# Patient Record
Sex: Female | Born: 1970 | Race: Black or African American | Hispanic: No | Marital: Married
Health system: Southern US, Community
[De-identification: ages and names within clinical notes are randomized; demographics above are authoritative.]

## PROBLEM LIST (undated history)

## (undated) HISTORY — PX: HERNIA REPAIR: SHX51

---

## 2020-04-04 ENCOUNTER — Emergency Department (HOSPITAL_COMMUNITY)
Admission: EM | Admit: 2020-04-04 | Discharge: 2020-04-04 | Disposition: A | Discharge status: Home / Self Care | Payer: BLUE CROSS/BLUE SHIELD | Attending: Emergency Medicine | Admitting: Emergency Medicine

## 2020-04-04 ENCOUNTER — Other Ambulatory Visit: Payer: Self-pay

## 2020-04-04 ENCOUNTER — Encounter (HOSPITAL_COMMUNITY): Payer: Self-pay

## 2020-04-04 DIAGNOSIS — R109 Unspecified abdominal pain: Secondary | ICD-10-CM | POA: Insufficient documentation

## 2020-04-04 DIAGNOSIS — M549 Dorsalgia, unspecified: Secondary | ICD-10-CM | POA: Diagnosis not present

## 2020-04-04 DIAGNOSIS — Z5321 Procedure and treatment not carried out due to patient leaving prior to being seen by health care provider: Secondary | ICD-10-CM | POA: Insufficient documentation

## 2020-04-04 LAB — COMPREHENSIVE METABOLIC PANEL
ALT: 13 U/L (ref 0–44)
AST: 14 U/L — ABNORMAL LOW (ref 15–41)
Albumin: 3.5 g/dL (ref 3.5–5.0)
Alkaline Phosphatase: 79 U/L (ref 38–126)
Anion gap: 10 (ref 5–15)
BUN: 12 mg/dL (ref 6–20)
CO2: 23 mmol/L (ref 22–32)
Calcium: 9.1 mg/dL (ref 8.9–10.3)
Chloride: 105 mmol/L (ref 98–111)
Creatinine, Ser: 0.92 mg/dL (ref 0.44–1.00)
GFR, Estimated: >60 mL/min (ref >60)
Glucose, Bld: 92 mg/dL (ref 70–99)
Potassium: 3.7 mmol/L (ref 3.5–5.1)
Sodium: 138 mmol/L (ref 135–145)
Total Bilirubin: 0.5 mg/dL (ref 0.3–1.2)
Total Protein: 7.1 g/dL (ref 6.5–8.1)

## 2020-04-04 LAB — CBC
HCT: 40.7 % (ref 36.0–46.0)
Hemoglobin: 13.3 g/dL (ref 12.0–15.0)
MCH: 23.8 pg — ABNORMAL LOW (ref 26.0–34.0)
MCHC: 32.7 g/dL (ref 30.0–36.0)
MCV: 72.9 fL — ABNORMAL LOW (ref 80.0–100.0)
Platelets: 218 10*3/uL (ref 150–400)
RBC: 5.58 MIL/uL — ABNORMAL HIGH (ref 3.87–5.11)
RDW: 15.9 % — ABNORMAL HIGH (ref 11.5–15.5)
WBC: 8.4 10*3/uL (ref 4.0–10.5)
nRBC: 0 % (ref 0.0–0.2)

## 2020-04-04 LAB — URINALYSIS, ROUTINE W REFLEX MICROSCOPIC
Bilirubin Urine: NEGATIVE
Glucose, UA: NEGATIVE mg/dL
Hgb urine dipstick: NEGATIVE
Ketones, ur: NEGATIVE mg/dL
Leukocytes,Ua: NEGATIVE
Nitrite: NEGATIVE
Protein, ur: NEGATIVE mg/dL
Specific Gravity, Urine: 1.021 (ref 1.005–1.030)
pH: 5 (ref 5.0–8.0)

## 2020-04-04 LAB — I-STAT BETA HCG BLOOD, ED (MC, WL, AP ONLY): I-stat hCG, quantitative: <5 m[IU]/mL (ref <5)

## 2020-04-04 LAB — LIPASE, BLOOD: Lipase: 23 U/L (ref 11–51)

## 2020-04-04 MED ORDER — ONDANSETRON 4 MG PO TBDP
4.0000 mg | ORAL_TABLET | Freq: Once | ORAL | Status: AC | PRN
  Administered 2020-04-04: 20:00:00 4 mg via ORAL
  Filled 2020-04-04: qty 1

## 2020-04-04 NOTE — ED Notes (Signed)
Pt asked for number for a cab, stated she's going to leave

## 2020-04-04 NOTE — ED Triage Notes (Signed)
Pt BIB [facility] for eval of abd pain x1wk, was in [place] around a week ago & admitted there for sepsis/kidney infection, endorses abd pain since discharge from hospital. States that pepto bismol was helping, but states it is no longer. Also endorses back pain x2-3 days, denies injury & urinary sx. Hx of endometriosis, hiatal hernia, ovarian cysts.   EKG unremarkable, CBG 135, VSS 20g LAC

## 2020-04-26 ENCOUNTER — Ambulatory Visit (HOSPITAL_COMMUNITY): Payer: Self-pay

## 2020-04-28 ENCOUNTER — Emergency Department (HOSPITAL_COMMUNITY): Payer: HRSA Program

## 2020-04-28 ENCOUNTER — Emergency Department (HOSPITAL_COMMUNITY)
Admission: EM | Admit: 2020-04-28 | Discharge: 2020-04-30 | Disposition: A | Discharge status: Home / Self Care | Payer: HRSA Program | Attending: Emergency Medicine | Admitting: Emergency Medicine

## 2020-04-28 ENCOUNTER — Other Ambulatory Visit: Payer: Self-pay

## 2020-04-28 ENCOUNTER — Encounter (HOSPITAL_COMMUNITY): Payer: Self-pay | Admitting: Emergency Medicine

## 2020-04-28 DIAGNOSIS — T50901A Poisoning by unspecified drugs, medicaments and biological substances, accidental (unintentional), initial encounter: Secondary | ICD-10-CM

## 2020-04-28 DIAGNOSIS — F332 Major depressive disorder, recurrent severe without psychotic features: Secondary | ICD-10-CM

## 2020-04-28 DIAGNOSIS — M79642 Pain in left hand: Secondary | ICD-10-CM | POA: Insufficient documentation

## 2020-04-28 DIAGNOSIS — R519 Headache, unspecified: Secondary | ICD-10-CM | POA: Diagnosis present

## 2020-04-28 DIAGNOSIS — U071 COVID-19: Secondary | ICD-10-CM | POA: Diagnosis not present

## 2020-04-28 DIAGNOSIS — S6992XA Unspecified injury of left wrist, hand and finger(s), initial encounter: Secondary | ICD-10-CM

## 2020-04-28 LAB — CBC WITH DIFFERENTIAL/PLATELET
Abs Immature Granulocytes: 0.03 10*3/uL (ref 0.00–0.07)
Basophils Absolute: 0 10*3/uL (ref 0.0–0.1)
Basophils Relative: 0 %
Eosinophils Absolute: 0.1 10*3/uL (ref 0.0–0.5)
Eosinophils Relative: 2 %
HCT: 38.1 % (ref 36.0–46.0)
Hemoglobin: 12.1 g/dL (ref 12.0–15.0)
Immature Granulocytes: 0 %
Lymphocytes Relative: 33 %
Lymphs Abs: 3 10*3/uL (ref 0.7–4.0)
MCH: 23.7 pg — ABNORMAL LOW (ref 26.0–34.0)
MCHC: 31.8 g/dL (ref 30.0–36.0)
MCV: 74.7 fL — ABNORMAL LOW (ref 80.0–100.0)
Monocytes Absolute: 0.4 10*3/uL (ref 0.1–1.0)
Monocytes Relative: 4 %
Neutro Abs: 5.6 10*3/uL (ref 1.7–7.7)
Neutrophils Relative %: 61 %
Platelets: 152 10*3/uL (ref 150–400)
RBC: 5.1 MIL/uL (ref 3.87–5.11)
RDW: 16.8 % — ABNORMAL HIGH (ref 11.5–15.5)
WBC: 9.2 10*3/uL (ref 4.0–10.5)
nRBC: 0 % (ref 0.0–0.2)

## 2020-04-28 LAB — COMPREHENSIVE METABOLIC PANEL
ALT: 24 U/L (ref 0–44)
AST: 30 U/L (ref 15–41)
Albumin: 3.6 g/dL (ref 3.5–5.0)
Alkaline Phosphatase: 82 U/L (ref 38–126)
Anion gap: 17 — ABNORMAL HIGH (ref 5–15)
BUN: 13 mg/dL (ref 6–20)
CO2: 19 mmol/L — ABNORMAL LOW (ref 22–32)
Calcium: 8.6 mg/dL — ABNORMAL LOW (ref 8.9–10.3)
Chloride: 104 mmol/L (ref 98–111)
Creatinine, Ser: 1 mg/dL (ref 0.44–1.00)
GFR, Estimated: >60 mL/min (ref >60)
Glucose, Bld: 139 mg/dL — ABNORMAL HIGH (ref 70–99)
Potassium: 3.1 mmol/L — ABNORMAL LOW (ref 3.5–5.1)
Sodium: 140 mmol/L (ref 135–145)
Total Bilirubin: 0.6 mg/dL (ref 0.3–1.2)
Total Protein: 6.9 g/dL (ref 6.5–8.1)

## 2020-04-28 LAB — URINALYSIS, ROUTINE W REFLEX MICROSCOPIC
Bilirubin Urine: NEGATIVE
Glucose, UA: NEGATIVE mg/dL
Hgb urine dipstick: NEGATIVE
Ketones, ur: NEGATIVE mg/dL
Leukocytes,Ua: NEGATIVE
Nitrite: NEGATIVE
Protein, ur: NEGATIVE mg/dL
Specific Gravity, Urine: 1.017 (ref 1.005–1.030)
pH: 5 (ref 5.0–8.0)

## 2020-04-28 LAB — RESP PANEL BY RT-PCR (FLU A&B, COVID) ARPGX2
Influenza A by PCR: NEGATIVE
Influenza B by PCR: NEGATIVE
SARS Coronavirus 2 by RT PCR: POSITIVE — AB

## 2020-04-28 LAB — SALICYLATE LEVEL: Salicylate Lvl: <7 mg/dL — ABNORMAL LOW (ref 7.0–30.0)

## 2020-04-28 LAB — RAPID URINE DRUG SCREEN, HOSP PERFORMED
Amphetamines: NOT DETECTED
Barbiturates: NOT DETECTED
Benzodiazepines: NOT DETECTED
Cocaine: POSITIVE — AB
Opiates: NOT DETECTED
Tetrahydrocannabinol: POSITIVE — AB

## 2020-04-28 LAB — ETHANOL: Alcohol, Ethyl (B): <10 mg/dL (ref <10)

## 2020-04-28 LAB — CBG MONITORING, ED: Glucose-Capillary: 142 mg/dL — ABNORMAL HIGH (ref 70–99)

## 2020-04-28 LAB — POC URINE PREG, ED: Preg Test, Ur: NEGATIVE

## 2020-04-28 LAB — ACETAMINOPHEN LEVEL: Acetaminophen (Tylenol), Serum: <10 ug/mL — ABNORMAL LOW (ref 10–30)

## 2020-04-28 MED ORDER — NALOXONE HCL 0.4 MG/ML IJ SOLN
0.4000 mg | Freq: Once | INTRAMUSCULAR | Status: AC
  Administered 2020-04-28: 0.4 mg via INTRAVENOUS
  Filled 2020-04-28: qty 1

## 2020-04-28 MED ORDER — POTASSIUM CHLORIDE CRYS ER 20 MEQ PO TBCR
40.0000 meq | EXTENDED_RELEASE_TABLET | Freq: Once | ORAL | Status: AC
  Administered 2020-04-28: 40 meq via ORAL
  Filled 2020-04-28: qty 2

## 2020-04-28 MED ORDER — ONDANSETRON HCL 4 MG/2ML IJ SOLN
4.0000 mg | Freq: Once | INTRAMUSCULAR | Status: AC
  Administered 2020-04-28: 4 mg via INTRAVENOUS
  Filled 2020-04-28: qty 2

## 2020-04-28 MED ORDER — SODIUM CHLORIDE 0.9 % IV BOLUS
1000.0000 mL | Freq: Once | INTRAVENOUS | Status: AC
  Administered 2020-04-28: 1000 mL via INTRAVENOUS

## 2020-04-28 MED ORDER — LORAZEPAM 0.5 MG PO TABS: 0.5000 mg | ORAL_TABLET | Freq: Once | ORAL | Status: DC

## 2020-04-28 NOTE — ED Notes (Addendum)
Patient brought back PD   IVC in progress   Still agitated
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
## 2020-04-28 NOTE — Progress Notes (Signed)
Orthopedic Tech Progress Note Patient Details:  Margurette Brener 1971/02/20 [id number]  Ortho Devices Type of Ortho Device: Finger splint Ortho Device/Splint Location: LUE Ortho Device/Splint Interventions: Ordered,Application,Adjustment   Post Interventions Patient Tolerated: Fair,Well Instructions Provided: Care of device   Donald Pore 04/28/2020, 3:58 PM

## 2020-04-28 NOTE — Social Work (Signed)
CSW following for possible d/c needs. 

## 2020-04-28 NOTE — ED Notes (Signed)
Pt transferred to wheelchair to get to restroom, pt tolerated well
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
## 2020-04-28 NOTE — ED Notes (Signed)
Patient transported to CT 

## 2020-04-28 NOTE — ED Notes (Signed)
Pure wick has been placed. Suction set to . Pt denied In & Out cath
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
## 2020-04-28 NOTE — ED Notes (Signed)
Pt temporarily moved to Rm 44 for TTS consult. Yellow RN, Renee, made aware

## 2020-04-28 NOTE — ED Notes (Signed)
Patient became very agitated and anxious quickly. Belongings had already been retrieved from patient and placed in a locker #12 before this RN had taken over.   Patient woke up and started yelling she wanted her phone and what was going on and that she needed to talk to her kids. I tried to offer patient a phone call and made the PA aware of situation, before anyone could talk to her she bolted for the back door.   This RN called for security and followed patient out to the back parking lot where she ran up stairs and out of site.   Security finally came and went to search for patient.   Patient IVC

## 2020-04-28 NOTE — ED Notes (Signed)
TTS complete  Sitter ordered for SI
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
## 2020-04-28 NOTE — ED Notes (Signed)
Ortho tech en route 

## 2020-04-29 DIAGNOSIS — F332 Major depressive disorder, recurrent severe without psychotic features: Secondary | ICD-10-CM

## 2020-04-29 DIAGNOSIS — T50901A Poisoning by unspecified drugs, medicaments and biological substances, accidental (unintentional), initial encounter: Secondary | ICD-10-CM

## 2020-04-29 MED ORDER — OLANZAPINE 5 MG PO TABS
5.0000 mg | ORAL_TABLET | Freq: Every day | ORAL | Status: DC
  Filled 2020-04-29: qty 1

## 2020-04-29 MED ORDER — HYDROXYZINE HCL 25 MG PO TABS: 25.0000 mg | ORAL_TABLET | Freq: Three times a day (TID) | ORAL | Status: DC | PRN

## 2020-04-29 MED ORDER — ZIPRASIDONE MESYLATE 20 MG IM SOLR: 20.0000 mg | Freq: Two times a day (BID) | INTRAMUSCULAR | Status: DC | PRN

## 2020-04-29 MED ORDER — GABAPENTIN 100 MG PO CAPS
200.0000 mg | ORAL_CAPSULE | Freq: Two times a day (BID) | ORAL | Status: DC
  Filled 2020-04-29: qty 2

## 2020-04-29 MED ORDER — DIPHENHYDRAMINE HCL 50 MG/ML IJ SOLN
50.0000 mg | Freq: Two times a day (BID) | INTRAMUSCULAR | Status: DC | PRN
  Administered 2020-04-29: 50 mg via INTRAMUSCULAR
  Filled 2020-04-29: qty 1

## 2020-04-29 MED ORDER — DIPHENHYDRAMINE HCL 50 MG/ML IJ SOLN: 50.0000 mg | Freq: Two times a day (BID) | INTRAMUSCULAR | Status: DC | PRN

## 2020-04-29 MED ORDER — ZIPRASIDONE MESYLATE 20 MG IM SOLR
20.0000 mg | Freq: Once | INTRAMUSCULAR | Status: AC
  Administered 2020-04-30: 20 mg via INTRAMUSCULAR
  Filled 2020-04-29: qty 20

## 2020-04-29 MED ORDER — ZIPRASIDONE MESYLATE 20 MG IM SOLR
10.0000 mg | Freq: Two times a day (BID) | INTRAMUSCULAR | Status: DC | PRN
  Administered 2020-04-29: 10 mg via INTRAMUSCULAR
  Filled 2020-04-29: qty 20

## 2020-04-29 NOTE — ED Notes (Signed)
+  Covid; Breakfast Order placed

## 2020-04-29 NOTE — ED Notes (Signed)
Patient refusing vital signs. Patient also walked out of room and into RR2 with no mask on after being instructed not to. Patient stated she will cooperate when she talk to another doctor.

## 2020-04-29 NOTE — ED Notes (Signed)
Staffing called for sitter, there is not one available at this time however one will be available at 7am for 12 hours

## 2020-04-29 NOTE — ED Notes (Signed)
Pt placed bedside commode in the hallway with bowel movement in bedside commode.

## 2020-04-29 NOTE — ED Notes (Addendum)
This RN told pt that gabapentin was due to be taken. Pt stated that she does not care about "medications or psychiatrists" and will not take it until she has received her meal tray. This RN explained to pt that there is no dinner tray currently available, and this RN had brought a sandwich bag instead for the mean time. Pt began yelling about not having "anything to eat since three o'clock, they just keep giving me sandwich bags." Pt pressed emergency call button on her exam room's wall screen, shut off room lights, and slammed the door shut. This RN and Candy, RN, reopened the door and turned the lights back on. Pt yelled that she has a headache but will not accept medicine to ease her headache. Pt yelled that she will not take any medication, including her due oral gabapentin, the "easy way or the hard way," until she has received warm food. RNs explained to pt that she is IVC. Continues to slam door and turn off lights every time this RN turns the lights back on and explains that the lights need to remain on.

## 2020-04-29 NOTE — ED Notes (Signed)
Pt slightly calmed down; pt called out and requested a sprite and pain medication.

## 2020-04-29 NOTE — Consult Note (Signed)
[facility] Psych ED Progress Note  04/29/2020 1:06 PM Chloe Valencia  MRN:  [id number] Subjective:  "you are not the person I saw yesterday.  I don't want to talk with you.  I need to get out of here, that's it."  Chloe Valencia, 50 y.o., female patient who is seen via telepsych by this provider for reassessment; chart reviewed and consulted with Dr. Dwyane Dee on 04/29/20.  On evaluation [name] report she does not want to talk with this Probation officer.  Patient is very agitated, very talkative and demanding to leave the facility.  She refuses to answer questions for assessment despite being informed of her reason for admission and subsequent IVC.  It is unclear how much information the patient heard as she continues to over talk this Probation officer.  Several attempts to redirect the patient was met with the patient vehemently voices her disdain for the hospital.  She is seen reaching for the telepsych monitor and then the call abruptly disconnected.    During evaluation [name] is seated on the hospital bed; She is alert/oriented x 3; visibly agitated and uncooperative to interview; mood is congruent with affect.  Patient is speaking in a clear tone, loud  volume, and fast pace; with minimal eye contact.  Her thought process is irrelevant and tangential. There is no indication that she is currently responding to internal/external stimuli or experiencing delusional thought content.  Due to agitation, unable to assess for suicidal/self-harm/homicidal ideation.    Per EDP Admission Notes 04/28/2020: Drug Overdose   50 year old female brought here via EMS for concerns of drug overdose.  Per EMS note, patient was found in an unknown building and was appear to be apneic and cyanotic when EMS arrived.  Patient was giving 1 mg of Narcan and she became a little more alert and able to answer some questions.  As patient was brought to the ED, she vomited once.  She does appear to be more awake.  However, on my evaluation, patient is  drowsy but does respond to painful stimuli and is protecting her airway.  At this time level 5 caveat applies for altered mental status.  History reviewed. No pertinent past medical history.  There are no problems to display for this patient.  Principal Problem: Major depressive disorder, recurrent episode, severe (HCC) Diagnosis:  Principal Problem:   Major depressive disorder, recurrent episode, severe (HCC) Active Problems:   Drug overdose  Total Time spent with patient: 30 minutes  Past Psychiatric History: unknown  Past Medical History: History reviewed. No pertinent past medical history.  Past Surgical History:  Procedure Laterality Date  . HERNIA REPAIR     Family History: No family history on file. Family Psychiatric  History: unknown Social History:  Social History   Substance and Sexual Activity  Alcohol Use None     Social History   Substance and Sexual Activity  Drug Use Not on file    Social History   Socioeconomic History  . Marital status: Married    Spouse name: Not on file  . Number of children: Not on file  . Years of education: Not on file  . Highest education level: Not on file  Occupational History  . Not on file  Tobacco Use  . Smoking status: Never Smoker  . Smokeless tobacco: Not on file  Substance and Sexual Activity  . Alcohol use: Not on file  . Drug use: Not on file  . Sexual activity: Not on file  Other Topics Concern  .  Not on file  Social History Narrative  . Not on file   Social Determinants of Health   Financial Resource Strain: Not on file  Food Insecurity: Not on file  Transportation Needs: Not on file  Physical Activity: Not on file  Stress: Not on file  Social Connections: Not on file    Sleep: Fair  Appetite:  Fair  Current Medications: Current Facility-Administered Medications  Medication Dose Route Frequency Provider Last Rate Last Admin  . ziprasidone (GEODON) injection 10 mg  10 mg Intramuscular Q12H  PRN Merlyn Lot E, NP       And  . diphenhydrAMINE (BENADRYL) injection 50 mg  50 mg Intramuscular Q12H PRN Merlyn Lot E, NP      . gabapentin (NEURONTIN) capsule 200 mg  200 mg Oral BID Merlyn Lot E, NP      . hydrOXYzine (ATARAX/VISTARIL) tablet 25 mg  25 mg Oral TID PRN Mallie Darting, NP      . LORazepam (ATIVAN) tablet 0.5 mg  0.5 mg Oral Once Alfredia Client, PA-C      . OLANZapine (ZYPREXA) tablet 5 mg  5 mg Oral Daily Mallie Darting, NP       No current outpatient medications on file.    Lab Results:  Results for orders placed or performed during the hospital encounter of 04/28/20 (from the past 48 hour(s))  CBC with Differential     Status: Abnormal   Collection Time: 04/28/20  6:24 AM  Result Value Ref Range   WBC 9.2 4.0 - 10.5 K/uL   RBC 5.10 3.87 - 5.11 MIL/uL   Hemoglobin 12.1 12.0 - 15.0 g/dL   HCT 38.1 36.0 - 46.0 %   MCV 74.7 (L) 80.0 - 100.0 fL   MCH 23.7 (L) 26.0 - 34.0 pg   MCHC 31.8 30.0 - 36.0 g/dL   RDW 16.8 (H) 11.5 - 15.5 %   Platelets 152 150 - 400 K/uL   nRBC 0.0 0.0 - 0.2 %   Neutrophils Relative % 61 %   Neutro Abs 5.6 1.7 - 7.7 K/uL   Lymphocytes Relative 33 %   Lymphs Abs 3.0 0.7 - 4.0 K/uL   Monocytes Relative 4 %   Monocytes Absolute 0.4 0.1 - 1.0 K/uL   Eosinophils Relative 2 %   Eosinophils Absolute 0.1 0.0 - 0.5 K/uL   Basophils Relative 0 %   Basophils Absolute 0.0 0.0 - 0.1 K/uL   Immature Granulocytes 0 %   Abs Immature Granulocytes 0.03 0.00 - 0.07 K/uL    Comment: Performed at [facility], [address] [place], [place] [address]  Comprehensive metabolic panel     Status: Abnormal   Collection Time: 04/28/20  6:24 AM  Result Value Ref Range   Sodium 140 135 - 145 mmol/L   Potassium 3.1 (L) 3.5 - 5.1 mmol/L   Chloride 104 98 - 111 mmol/L   CO2 19 (L) 22 - 32 mmol/L   Glucose, Bld 139 (H) 70 - 99 mg/dL    Comment: Glucose reference range applies only to samples taken after fasting for at least 8 hours.   BUN 13 6  - 20 mg/dL   Creatinine, Ser 1.00 0.44 - 1.00 mg/dL   Calcium 8.6 (L) 8.9 - 10.3 mg/dL   Total Protein 6.9 6.5 - 8.1 g/dL   Albumin 3.6 3.5 - 5.0 g/dL   AST 30 15 - 41 U/L   ALT 24 0 - 44 U/L  Alkaline Phosphatase 82 38 - 126 U/L   Total Bilirubin 0.6 0.3 - 1.2 mg/dL   GFR, Estimated >60 >60 mL/min    Comment: (NOTE) Calculated using the CKD-EPI Creatinine Equation (2021)    Anion gap 17 (H) 5 - 15    Comment: Performed at [facility] [address] [place], [place] [address]  Ethanol     Status: None   Collection Time: 04/28/20  6:25 AM  Result Value Ref Range   Alcohol, Ethyl (B) <10 <10 mg/dL    Comment: (NOTE) Lowest detectable limit for serum alcohol is 10 mg/dL.  For medical purposes only. Performed at [facility], [place] [address] [place], [place] [address]   Salicylate level     Status: Abnormal   Collection Time: 04/28/20  6:25 AM  Result Value Ref Range   Salicylate Lvl <8.2 (L) 7.0 - 30.0 mg/dL    Comment: Performed at [facility] [address], [place], [place] [address]  Acetaminophen level     Status: Abnormal   Collection Time: 04/28/20  6:25 AM  Result Value Ref Range   Acetaminophen (Tylenol), Serum <10 (L) 10 - 30 ug/mL    Comment: (NOTE) Therapeutic concentrations vary significantly. A range of 10-30 ug/mL  may be an effective concentration for many patients. However, some  are best treated at concentrations outside of this range. Acetaminophen concentrations >150 ug/mL at 4 hours after ingestion  and >50 ug/mL at 12 hours after ingestion are often associated with  toxic reactions.  Performed at [facility], [place] [address] [place], [place] [address]   CBG monitoring, ED     Status: Abnormal   Collection Time: 04/28/20  6:30 AM  Result Value Ref Range   Glucose-Capillary 142 (H) 70 - 99 mg/dL    Comment: Glucose reference range applies only to samples taken after fasting for at least 8 hours.  Rapid urine drug  screen (hospital performed)     Status: Abnormal   Collection Time: 04/28/20  4:00 PM  Result Value Ref Range   Opiates NONE DETECTED NONE DETECTED   Cocaine POSITIVE (A) NONE DETECTED   Benzodiazepines NONE DETECTED NONE DETECTED   Amphetamines NONE DETECTED NONE DETECTED   Tetrahydrocannabinol POSITIVE (A) NONE DETECTED   Barbiturates NONE DETECTED NONE DETECTED    Comment: (NOTE) DRUG SCREEN FOR MEDICAL PURPOSES ONLY.  IF CONFIRMATION IS NEEDED FOR ANY PURPOSE, NOTIFY LAB WITHIN 5 DAYS.  LOWEST DETECTABLE LIMITS FOR URINE DRUG SCREEN Drug Class                     Cutoff (ng/mL) Amphetamine and metabolites    1000 Barbiturate and metabolites    200 Benzodiazepine                 784 Tricyclics and metabolites     300 Opiates and metabolites        300 Cocaine and metabolites        300 THC                            50 Performed at [facility], [place] [address] [place], [place] [address]   Urinalysis, Routine w reflex microscopic Urine, Clean Catch     Status: None   Collection Time: 04/28/20  4:25 PM  Result Value Ref Range   Color, Urine YELLOW YELLOW   APPearance CLEAR  CLEAR   Specific Gravity, Urine 1.017 1.005 - 1.030   pH 5.0 5.0 - 8.0   Glucose, UA NEGATIVE NEGATIVE mg/dL   Hgb urine dipstick NEGATIVE NEGATIVE   Bilirubin Urine NEGATIVE NEGATIVE   Ketones, ur NEGATIVE NEGATIVE mg/dL   Protein, ur NEGATIVE NEGATIVE mg/dL   Nitrite NEGATIVE NEGATIVE   Leukocytes,Ua NEGATIVE NEGATIVE    Comment: Performed at [facility] [address], [place], [place] [address]  POC urine preg, ED     Status: None   Collection Time: 04/28/20  4:31 PM  Result Value Ref Range   Preg Test, Ur NEGATIVE NEGATIVE    Comment:        THE SENSITIVITY OF THIS METHODOLOGY IS >24 mIU/mL   Resp Panel by RT-PCR (Flu A&B, Covid) Nasopharyngeal Swab     Status: Abnormal   Collection Time: 04/28/20  5:44 PM   Specimen: Nasopharyngeal Swab; Nasopharyngeal(NP) swabs in vial  transport medium  Result Value Ref Range   SARS Coronavirus 2 by RT PCR POSITIVE (A) NEGATIVE    Comment: RESULT CALLED TO, READ BACK BY AND VERIFIED WITH: Shon Hale RN [id number] 04/28/20 A BROWNING (NOTE) SARS-CoV-2 target nucleic acids are DETECTED.  The SARS-CoV-2 RNA is generally detectable in upper respiratory specimens during the acute phase of infection. Positive results are indicative of the presence of the identified virus, but do not rule out bacterial infection or co-infection with other pathogens not detected by the test. Clinical correlation with patient history and other diagnostic information is necessary to determine patient infection status. The expected result is Negative.  Fact Sheet for Patients: EntrepreneurPulse.com.au  Fact Sheet for Healthcare Providers: IncredibleEmployment.be  This test is not yet approved or cleared by the [place] FDA and  has been authorized for detection and/or diagnosis of SARS-CoV-2 by FDA under an Emergency Use Authorization (EUA).  This EUA will remain in effect (meaning this test can b e used) for the duration of  the COVID-19 declaration under Section 564(b)(1) of the Act, 21 U.S.C. section 360bbb-3(b)(1), unless the authorization is terminated or revoked sooner.     Influenza A by PCR NEGATIVE NEGATIVE   Influenza B by PCR NEGATIVE NEGATIVE    Comment: (NOTE) The Xpert Xpress SARS-CoV-2/FLU/RSV plus assay is intended as an aid in the diagnosis of influenza from Nasopharyngeal swab specimens and should not be used as a sole basis for treatment. Nasal washings and aspirates are unacceptable for Xpert Xpress SARS-CoV-2/FLU/RSV testing.  Fact Sheet for Patients: EntrepreneurPulse.com.au  Fact Sheet for Healthcare Providers: IncredibleEmployment.be  This test is not yet approved or cleared by the [place] FDA and has been authorized for detection  and/or diagnosis of SARS-CoV-2 by FDA under an Emergency Use Authorization (EUA). This EUA will remain in effect (meaning this test can be used) for the duration of the COVID-19 declaration under Section 564(b)(1) of the Act, 21 U.S.C. section 360bbb-3(b)(1), unless the authorization is terminated or revoked.  Performed at [facility], [place] [address], [place],  [address]     Blood Alcohol level:  Lab Results  Component Value Date   ETH <10 04/28/2020    Physical Findings: AIMS:  , ,  ,  ,    CIWA:    COWS:     Musculoskeletal: Unable to complete via telepsych,   Psychiatric Specialty Exam: Physical Exam HENT:     Head: Normocephalic.     Nose: Nose normal.  Eyes:     Pupils: Pupils  are equal, round, and reactive to light.  Pulmonary:     Effort: Pulmonary effort is normal.  Musculoskeletal:        General: Normal range of motion.     Cervical back: Normal range of motion.  Neurological:     Mental Status: She is alert and oriented to person, place, and time.  Psychiatric:        Mood and Affect: Affect is angry and inappropriate.        Speech: Speech is rapid and pressured.        Behavior: Behavior is uncooperative, agitated and hyperactive.        Thought Content: Thought content includes suicidal (suicidal ideation/behaviors present on admission) ideation.        Cognition and Memory: Cognition normal.        Judgment: Judgment is inappropriate.     Comments: See psych assessment     Review of Systems  Blood pressure 97/64, pulse 72, temperature 99.1 F (37.3 C), temperature source Oral, resp. rate 18, SpO2 96 %.There is no height or weight on file to calculate BMI.  General Appearance: Bizarre and Guarded  Eye Contact:  Minimal  Speech:  Pressured  Volume:  Increased  Mood:  Angry and Irritable  Affect:  Congruent, Inappropriate and Restricted  Thought Process:  Goal Directed, Irrelevant and Descriptions of Associations: Tangential   Orientation:  Full (Time, Place, and Person)  Thought Content:  Illogical and Rumination  Suicidal Thoughts:  Yes.  with intent/plan  Homicidal Thoughts:  unable to assess  Memory:  NA  Judgement:  Impaired  Insight:  Lacking  Psychomotor Activity:  Increased  Concentration:  Concentration: Poor and Attention Span: Poor  Recall:  NA  Fund of Knowledge:  NA  Language:  Good  Akathisia:  NA  Handed:  Right  AIMS (if indicated):     Assets:  Physical Health  ADL's:  Intact  Cognition:  WNL  Sleep:   <6 hours   Treatment Plan Summary: Daily contact with patient to assess and evaluate symptoms and progress in treatment and Medication management  Since the nurse Aldona Bar, RN reports the patient has been cooperative with her, requests to touch base with the patient regarding need for assessment for continued care recommendations.    In the interim, I have reviewed her chart, labs, EKG.  Her UDS is positive for cocaine and THC; In the setting of suspected suicidal ideations/behaviors prior to arrival, she meets criteria for inpatient. It would be helpful to learn if patient intended to harm herself or if this was accidental in the setting of continued drug use/abuse.  At this time, recommended continued observation for safety, can reassess in 24 hours.  The following medications were recommended to help promote mood stability.    Gabapentin 200 mg BID for withdrawal symptoms Hydroxyzine 80m po TID prn for anxiety Olanzapine 524mpo daily for mood stabilization   ShMallie DartingNP 04/29/2020, 1:06 PM

## 2020-04-29 NOTE — ED Notes (Signed)
Pt ran out the room again, security called to locate the pt again. Pt was located in the bathroom in blue zone and locks herself in there. Security had to break open the door to reach the patient. Pt was pressing her legs against the door and resisting security to enter. She was found on the floor and escorted to room 53 in purple zone.

## 2020-04-29 NOTE — ED Notes (Signed)
Pt stated that she ran out the room because she was hungry and wanted to make a phone call with a family member.

## 2020-04-29 NOTE — ED Notes (Signed)
Pt refused vital signs.

## 2020-04-29 NOTE — ED Notes (Addendum)
Patient refusing vital at this time, attempted to put patient in burgundy scrubs patient refused. Food and fluids given.

## 2020-04-29 NOTE — ED Notes (Signed)
Pt was escorted to ED room 53 with security and [employer]. Pt was aggressive and very agitated and was yelling at staff upon arrival to room 53. Pt slapped room door and yelled at all staff "to get out."

## 2020-04-29 NOTE — ED Notes (Addendum)
This RN spoke to [facility] provider Ophelia Shoulder, NP regarding pt's request. Orders placed by provider.

## 2020-04-29 NOTE — ED Notes (Signed)
Pt walked out of room without face mask after being asked not to, security called to locate this pt back to room 29.

## 2020-04-30 MED ORDER — LORAZEPAM 2 MG/ML IJ SOLN
1.0000 mg | Freq: Once | INTRAMUSCULAR | Status: AC
  Administered 2020-04-30: 1 mg via INTRAMUSCULAR
  Filled 2020-04-30: qty 1

## 2020-04-30 NOTE — ED Notes (Signed)
Pt currently asleep in the bed. This RN went in to check on pt. Pt denies any pain or SI/HI. Pt requesting [place] to turn off the lights and let her sleep. Will continue to monitor pt.

## 2020-04-30 NOTE — ED Notes (Addendum)
Pt continuing to scream and remain violent & uncooperative. Per Dr. Preston Fleeting, 4-point violent restraints placed on pt by this RN & Candy, RN while held still by security and [employer].

## 2020-04-30 NOTE — ED Notes (Addendum)
This RN entered pt room for restraint assessment and vital sign check. Pt found out of all restraint cuffs in her bed, asleep. All restraints placed back on pt by this RN and Candy, RN, with security and [employer] at bedside to hold pt still. Pt attempted to grab this RNs leg while this RN replaced leg cuff-- pt was blocked by security from grabbing this RN.  During the vital sign check after restraint cuffs were placed back on pt, pt demanded to be released from cuffs. This RN reminded pt again that pt needs to demonstrate compliance with staff and instructions before cuffs are removed

## 2020-04-30 NOTE — Progress Notes (Addendum)
CSW provided pt with the following resources in discharge instructions (pt is planning to go to [place] at [place] to live with her sister):  [facility] (Please go to walk in clinic within 3 days of discharge to be assessed for psychiatric medication management and outpatient therapy services. Address: [address] [place], [place] [address] Hours:  Sunday Closed Monday 8AM-5PM Tuesday 8AM-5PM Wednesday 8AM-5PM Thursday 8AM-5PM Friday 8AM-5PM Saturday Closed Suggest new hours   [facility] resources/  Substance abuse resources and Residential Options:  [facility] day residential substance abuse facility (not an option if you have active assault charges). [address] [place], [place] [address] Phone: [phone]: Ask for Drema Pry in admissions to complete intake if interested in pursuing this option.  [facility]: Can get intake scheduled; (not an option if you have active assault charges). [address] [place], [place] [phone]) Call Mon-Fri.  [facility]): (offers outpatient therapy and intensive outpatient substance abuse therapy).  [address] [place], [place] [address] Phone: [phone]  [facility]: Offers FREE recovery skills classes, support groups, 1:1 Peer Support, and Compeer Classes. [address] [place], [place] [address] Phone: [phone] (Call to complete intake).   [facility] [address] [place], [place] [address] Phone: [phone] ext [phone]  [facility] provides food, shelter and other programs and services to the homeless men of [place] through our [employer] program.  By offering safe shelter, three meals a day, clean clothing, Biblical counseling, financial planning, vocational training, GED/education and employment assistance, we've helped mend the shattered lives of many homeless men since opening in  1974.  We have approximately 267 beds available, with a max of 312 beds including mats for emergency situations and currently house an average of 270 men a night.  Prospective Client Check-In Information Photo ID Required (State/ Out of State/ [facility]) - if photo ID is not available, clients are required to have a printout of a police/sheriff's criminal history report. Help out with chores around the [facility]. No sex offender of any type (pending, charged, registered and/or any other sex related offenses) will be permitted to check in. Must be willing to abide by all rules, regulations, and policies established by the [employer]. The following will be provided - shelter, food, clothing, and biblical counseling. If you or someone you know is in need of assistance at our [facility] shelter in [place], [place], please call [phone] ext. [phone].  [facility]: Outpatient therapy. Offices available in [place], [place], [place]. Call [phone] to schedule and verify insurance.  [facility]: Outpatient therapy. Offices available statewide. Call [phone] to schedule and verify insurance.   [facility] [place] [facility] [address] [place], [place] [address] PHONE: [phone] Hours: Monday-Friday 8:00AM-3:00PM  This agency offers the following services for free:   *Integrated Care -Case management -[facility] [address] [employer] -Medical clinic -Mental health nurse -Referrals  *[facility] Services -Showers and hygiene supplies -Laundry -Phone access -Mailing addresses and mailboxes -Replacement IDs -Onsite barbershop -Storage lockers -[employer] winter warming center  *Engineer, drilling -Skilled trade classes -Job skills classes -Resume and jobs application assistance -Interview training -GED classes -Audiological scientist vouchers -Buyer, retail S. Alan Ripper, MSW, LCSW Clinical  Social Worker 04/30/2020 4:41 PM

## 2020-04-30 NOTE — ED Notes (Signed)
Pt requesting to come out of violent restraints. This RN spoke with pt about being calm and cooperative with staff. Pt agreed to this terms. Violent restraints will be discontinued at this time and this RN will continue to monitor. Pt currently resting in the bed, VS WNL, respirations even and unlabored.
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
## 2020-04-30 NOTE — BH Assessment (Signed)
Patient was seen for reassessment.  Patient is currently calm and cooperative and requesting to go home.  Patient states that she has a drug problem and states that her overdose was accidental and that she was not trying to kill herself.  Patient states that she has no history of any suicide attempts of inpatient treatment services for psychiatric issues.  However, patient states that she has been to a drug rehab program in the past, [facility] [address], that she has contacted and plans to return to, but states that she has to take care of her court date first.  Patient states that she has a safe place to stay with her sister in [place] and states that her sister plans to buy her a bus ticket to get there.  Patient is able to contract for safety.  TTS staffed patient with Ophelia Shoulder, NP who is in agreement with patient's discharge.  However, she will most like have Peer Support to provide her with resources for [employer].

## 2020-04-30 NOTE — ED Provider Notes (Addendum)
Patient became violent and was given doses of trazodone.  In spite of ziprasidone, she continued to be somewhat agitated and needed restraints for her safety and for safety staff.  12:56 AM Patient still agitated and fighting restraints.  We will add lorazepam.  CRITICAL CARE Performed by: Dione Booze Total critical care time: 35 minutes Critical care time was exclusive of separately billable procedures and treating other patients. Critical care was necessary to treat or prevent imminent or life-threatening deterioration. Critical care was time spent personally by me on the following activities: development of treatment plan with patient and/or surrogate as well as nursing, discussions with consultants, evaluation of patient's response to treatment, examination of patient, obtaining history from patient or surrogate, ordering and performing treatments and interventions, ordering and review of laboratory studies, ordering and review of radiographic studies, pulse oximetry and re-evaluation of patient's condition.   Dione Booze, MD 04/30/20 Lazarus Gowda    Dione Booze, MD 04/30/20 [phone]

## 2020-04-30 NOTE — ED Notes (Signed)
Pt's belongings retrieved from locker 7 and 12 and given to pt.

## 2020-04-30 NOTE — ED Notes (Signed)
RN messaged EDP for continuation or release of violent restraints

## 2020-04-30 NOTE — ED Notes (Signed)
This RN entered pt room for restraint assessment and to obtain vital signs. Upon awakening, pt was noncompliant for vital signs, thrashing away and not listening to this RN's instructions, including allowing this RN to assess her temperature. Obtained in axilla.

## 2020-04-30 NOTE — ED Provider Notes (Signed)
4:00 PM The behavioral health team and nursing contacted me to let me know that they feel she is now psychiatrically clear and that she did not intentionally try to hurt herself.  They feel she is stable for retention of IVC and discharged with outpatient follow-up.  They have documented this in their note so I will reverse the IVC and she will be discharged for outpatient follow-up.  4:56 PM IVC has been rescinded.  I went to assess the patient and she is feeling well.  She agrees with discharge and follow-up instructions and outpatient resources.  Patient was calling her ride.  She had no complaints.  She appeared comfortable in no distress.     Clinical Impression: 1. Acute drug overdose, accidental or unintentional, initial encounter   2. Injury of left thumb, initial encounter   3. COVID-19     Disposition: Discharge  Condition: Good  I have discussed the results, Dx and Tx plan with the pt(& family if present). He/she/they expressed understanding and agree(s) with the plan. Discharge instructions discussed at great length. Strict return precautions discussed and pt &/or family have verbalized understanding of the instructions. No further questions at time of discharge.    New Prescriptions   No medications on file    Follow Up: No follow-up provider specified.   Will discharge per psychiatry plans.   Moksh Loomer, Canary Brim, MD 04/30/20 [phone]
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
## 2020-04-30 NOTE — Discharge Instructions (Addendum)
[facility] (Please go to walk in clinic within 3 days of discharge to be assessed for psychiatric medication management and outpatient therapy services. Address: [address] [place], [place] [address] Hours:  Sunday Closed Monday 8AM-5PM Tuesday 8AM-5PM Wednesday 8AM-5PM Thursday 8AM-5PM Friday 8AM-5PM Saturday Closed Suggest new hours   [facility] resources/  Substance abuse resources and Residential Options:  [facility] day residential substance abuse facility (not an option if you have active assault charges). [address] [place], [place] [address] Phone: [phone]: Ask for Drema Pry in admissions to complete intake if interested in pursuing this option.  [facility]: Can get intake scheduled; (not an option if you have active assault charges). [address] [place], [place] [phone]) Call Mon-Fri.  [facility]): (offers outpatient therapy and intensive outpatient substance abuse therapy).  [address] [place], [place] [address] Phone: [phone]  [facility]: Offers FREE recovery skills classes, support groups, 1:1 Peer Support, and Compeer Classes. [address], [place], [place] [address] Phone: [phone] (Call to complete intake).   [facility] [address] [place], [place] [address] Phone: [phone] ext [phone]  [facility] provides food, shelter and other programs and services to the homeless men of -[facility] through our mens program.  By offering safe shelter, three meals a day, clean clothing, Biblical counseling, financial planning, vocational training, GED/education and employment assistance, weve helped mend the shattered lives of many homeless men since opening in 1974.  We have approximately 267 beds available, with a max of 312 beds including mats for emergency situations and currently house an average of 270 men a  night.  Prospective Client Check-In Information Photo ID Required (State/ Out of State/ [facility]) - if photo ID is not available, clients are required to have a printout of a police/sheriffs criminal history report. Help out with chores around the [facility]. No sex offender of any type (pending, charged, registered and/or any other sex related offenses) will be permitted to check in. Must be willing to abide by all rules, regulations, and policies established by the [employer]. The following will be provided - shelter, food, clothing, and biblical counseling. If you or someone you know is in need of assistance at our mens shelter in [place], [place], please call [phone] ext. [phone].  [facility]: Outpatient therapy. Offices available in [place], [place], [place]. Call [phone] to schedule and verify insurance.  [facility]: Outpatient therapy. Offices available statewide. Call [phone] to schedule and verify insurance.   [facility] [facility] [address] [place], [place] [address] PHONE: [phone] Hours: Monday-Friday 8:00AM-3:00PM  This agency offers the following services for free:   *Integrated Care -Case management -[facility] [employer] -Medical clinic -Mental health nurse -Referrals  *Fund[employer] Services -Showers and hygiene supplies -Laundry -Phone access -Mailing addresses and mailboxes -Replacement IDs -[employer] bar[employer]hop -Storage lockers -[employer] winter warming center  *Engineer, drilling -Skilled trade classes -Job skills classes -Resume and jobs application assistance -Interview training -GED classes -Environmental health practitioner -Warden/ranger

## 2020-04-30 NOTE — ED Notes (Signed)
Pt currently calm and cooperative sitting up in the bed. Respirations unlabored and even. VS WNL. Pt requesting something to eat for lunch. This RN gave pt her lunch tray. Will continue to monitor.

## 2021-08-14 IMAGING — CT CT HEAD W/O CM
4 series · 16 of 47 positions shown, 18 images · non-contrast
Comparison: No pertinent prior exams available for comparison.

CLINICAL DATA: Mental status change, unknown cause.

EXAM:
CT HEAD WITHOUT CONTRAST
TECHNIQUE: Contiguous axial images were obtained from the base of the skull
through the vertex without intravenous contrast.

[Series 3: head without · axial · non-contrast · 0.45mm/px · z∈[-112,+13]mm · 7 of 35 slices shown, 9 images]
[im 5/35  brain]
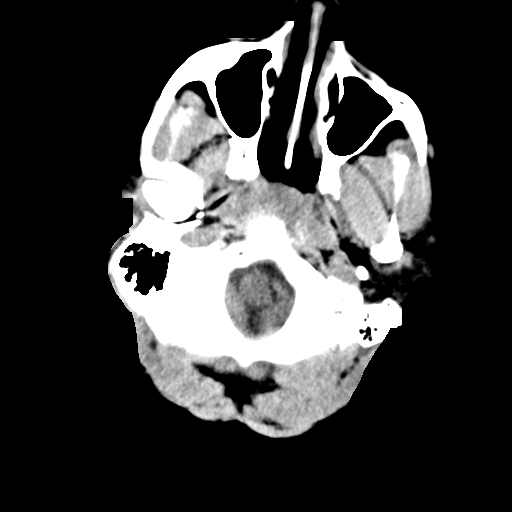
[im 5/35  bone]
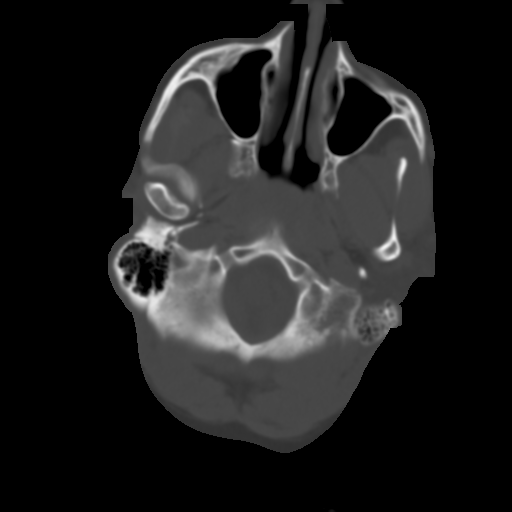
[im 9/35  brain]
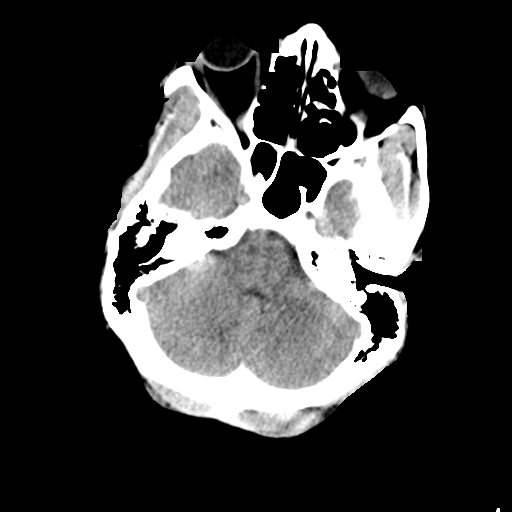
[im 13/35  brain]
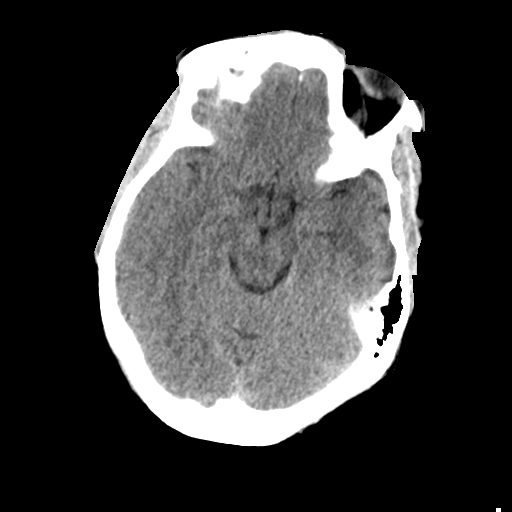
[im 18/35  brain]
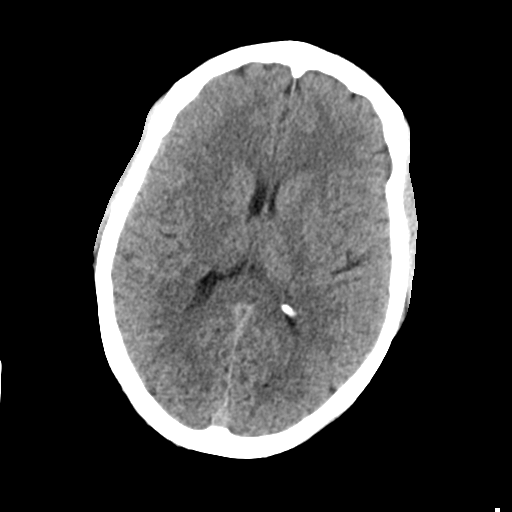
[im 22/35  brain]
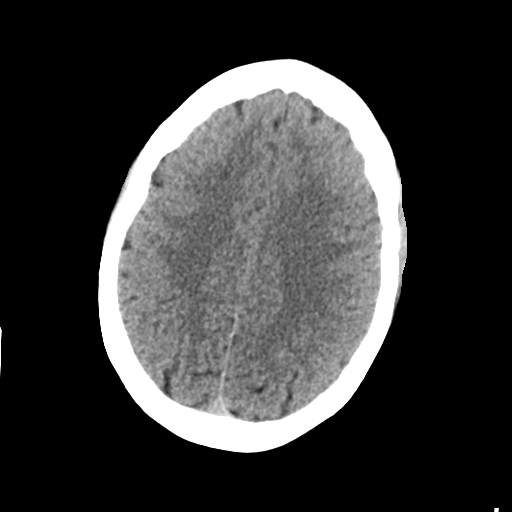
[im 22/35  bone]
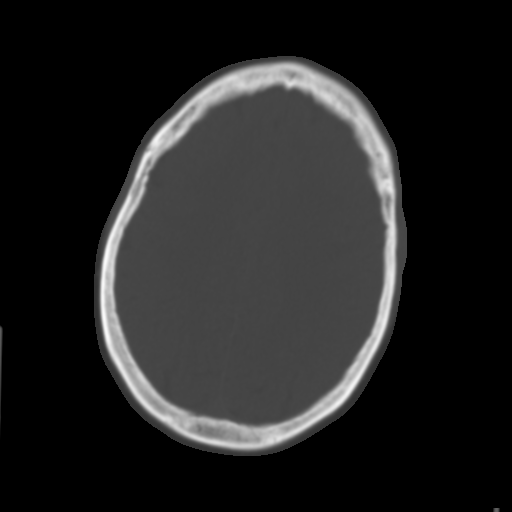
[im 26/35  brain]
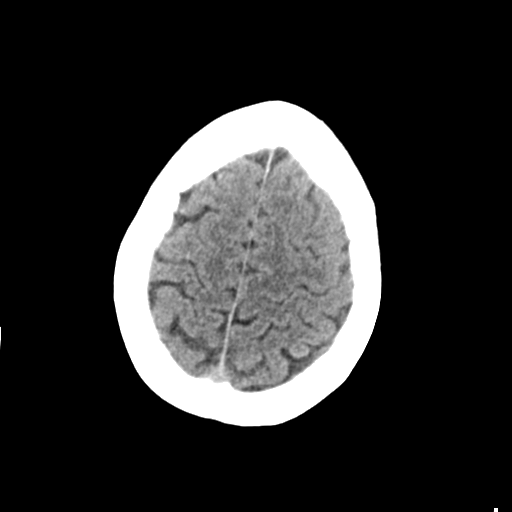
[im 30/35  brain]
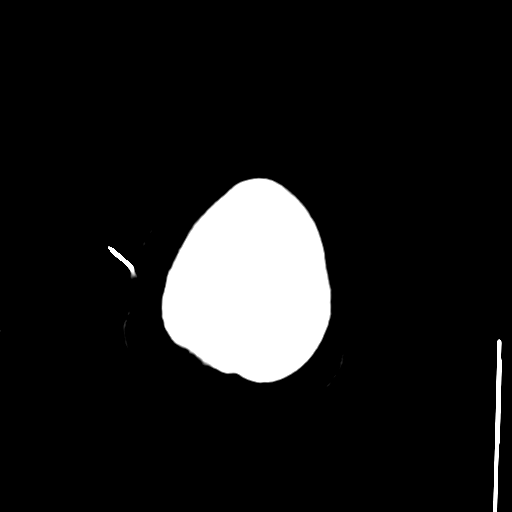

[Series 4: head bone · axial · 0.45mm/px · z∈[-116,-82]mm · 3 of 87 slices shown]
[im 9/87  bone]
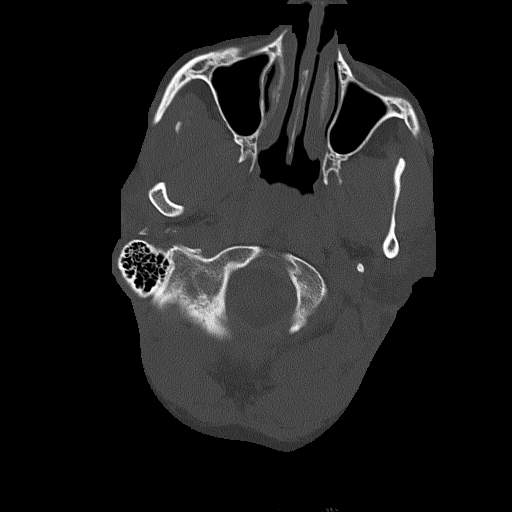
[im 18/87  bone]
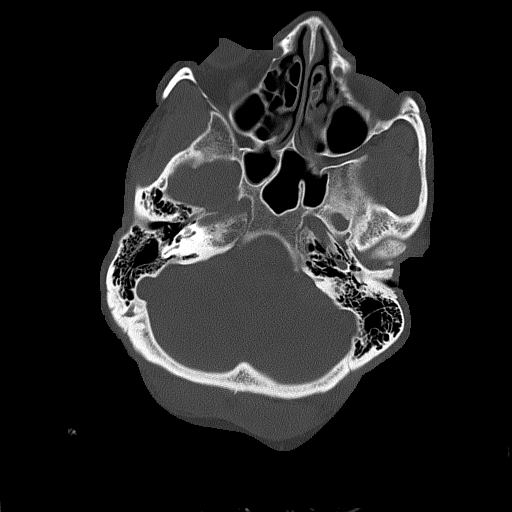
[im 26/87  bone]
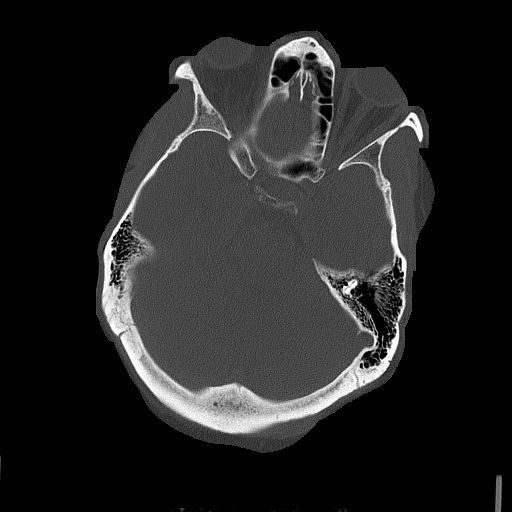

[Series 5: head without cor · coronal · non-contrast · 0.34mm/px · 3 of 73 slices shown]
[im 25/73  brain]
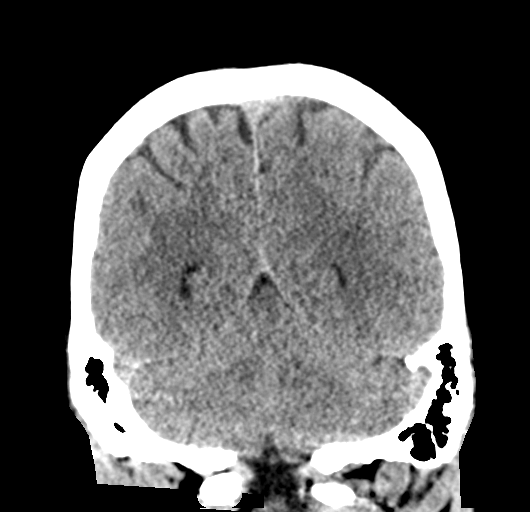
[im 33/73  brain]
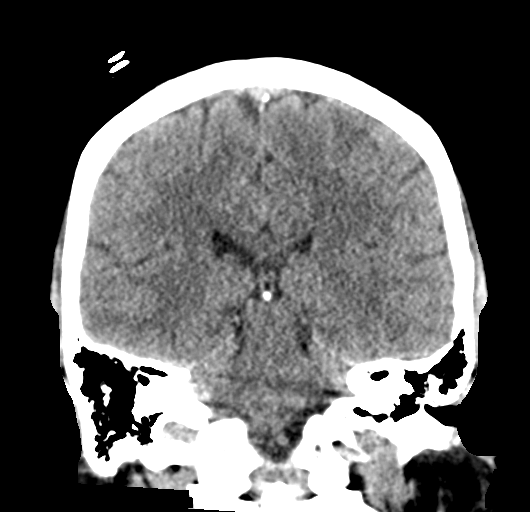
[im 41/73  brain]
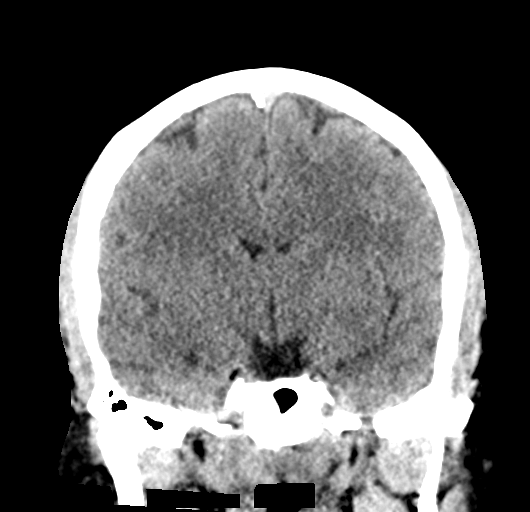

[Series 6: head without sag · sagittal · non-contrast · 0.34mm/px · 3 of 60 slices shown]
[im 20/60  brain]
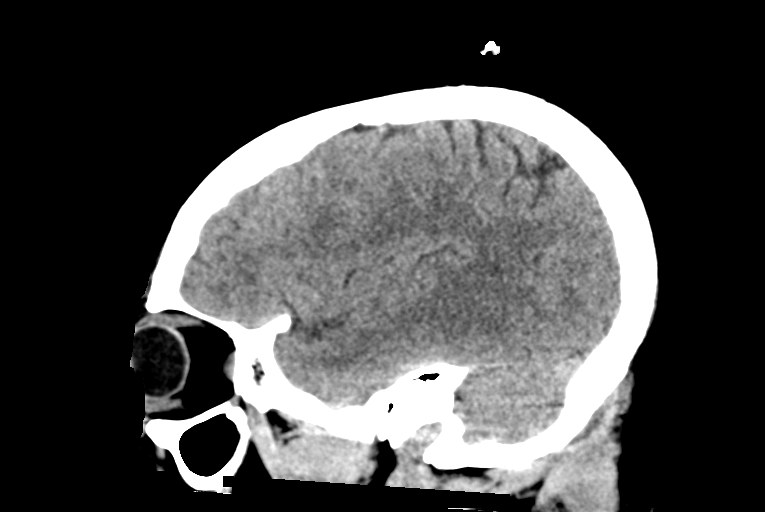
[im 30/60  brain]
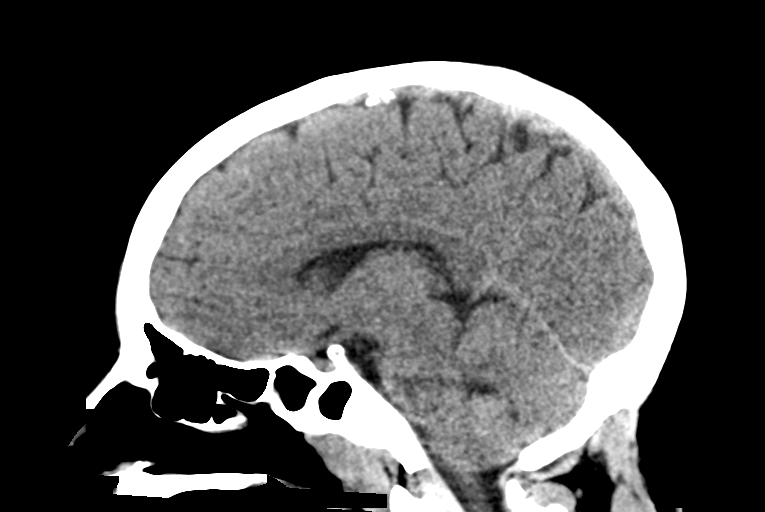
[im 40/60  brain]
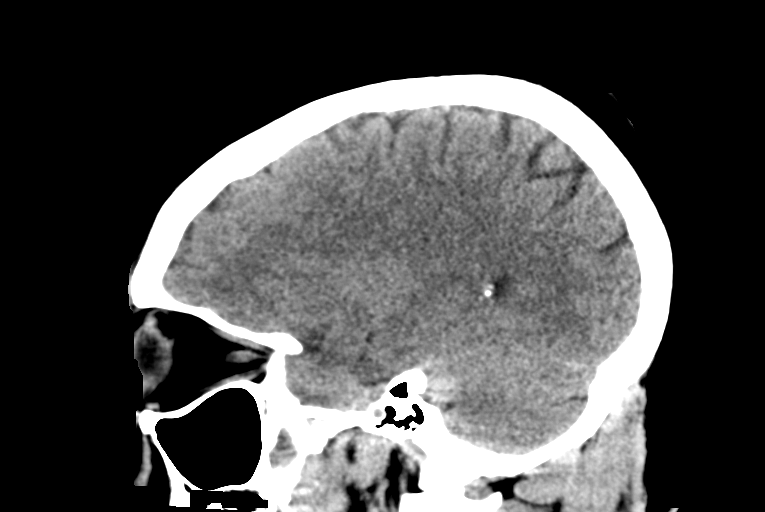

[16 of 47 positions shown; findings below may reference images not displayed]

FINDINGS: Brain:

Cerebral volume is normal.

There is no acute intracranial hemorrhage.

No demarcated cortical infarct.

No extra-axial fluid collection.

No evidence of intracranial mass.

No midline shift.

Vascular: No hyperdense vessel.  Atherosclerotic calcifications.

Skull: Normal. Negative for fracture or focal lesion.

Sinuses/Orbits: Visualized orbits show no acute finding. Trace
scattered paranasal sinus mucosal thickening.
IMPRESSION: No evidence of acute intracranial abnormality.

Mild paranasal sinus mucosal thickening.

## 2022-10-01 ENCOUNTER — Emergency Department (HOSPITAL_COMMUNITY)
Admission: EM | Admit: 2022-10-01 | Discharge: 2022-10-02 | Disposition: A | Discharge status: Home / Self Care | Payer: 59 | Attending: Emergency Medicine | Admitting: Emergency Medicine

## 2022-10-01 ENCOUNTER — Encounter (HOSPITAL_COMMUNITY): Payer: Self-pay

## 2022-10-01 DIAGNOSIS — I10 Essential (primary) hypertension: Secondary | ICD-10-CM | POA: Diagnosis not present

## 2022-10-01 DIAGNOSIS — R519 Headache, unspecified: Secondary | ICD-10-CM | POA: Diagnosis not present

## 2022-10-01 DIAGNOSIS — D5700 Hb-SS disease with crisis, unspecified: Secondary | ICD-10-CM | POA: Diagnosis not present

## 2022-10-01 NOTE — ED Notes (Addendum)
Unable to obtain BP in triage. Pt ripped BP cuff off and threw it on the floor. Triage RN aware.

## 2022-10-01 NOTE — ED Triage Notes (Signed)
EMS reports patient is having pain in the bilateral legs and in the back of her head. Patient states this is related to her sickle cell. Patient has been medicating at home for 4 days with no relief.

## 2022-10-02 ENCOUNTER — Emergency Department (HOSPITAL_COMMUNITY): Payer: 59

## 2022-10-02 DIAGNOSIS — R519 Headache, unspecified: Secondary | ICD-10-CM | POA: Diagnosis not present

## 2022-10-02 LAB — COMPREHENSIVE METABOLIC PANEL
ALT: 14 U/L (ref 0–44)
AST: 16 U/L (ref 15–41)
Albumin: 3.3 g/dL — ABNORMAL LOW (ref 3.5–5.0)
Alkaline Phosphatase: 96 U/L (ref 38–126)
Anion gap: 9 (ref 5–15)
BUN: 10 mg/dL (ref 6–20)
CO2: 24 mmol/L (ref 22–32)
Calcium: 8.9 mg/dL (ref 8.9–10.3)
Chloride: 104 mmol/L (ref 98–111)
Creatinine, Ser: 0.8 mg/dL (ref 0.44–1.00)
GFR, Estimated: >60 mL/min (ref >60)
Glucose, Bld: 99 mg/dL (ref 70–99)
Potassium: 3.8 mmol/L (ref 3.5–5.1)
Sodium: 137 mmol/L (ref 135–145)
Total Bilirubin: 0.4 mg/dL (ref 0.3–1.2)
Total Protein: 7.7 g/dL (ref 6.5–8.1)

## 2022-10-02 LAB — CBC WITH DIFFERENTIAL/PLATELET
Abs Immature Granulocytes: 0.03 10*3/uL (ref 0.00–0.07)
Basophils Absolute: 0.1 10*3/uL (ref 0.0–0.1)
Basophils Relative: 1 %
Eosinophils Absolute: 0.2 10*3/uL (ref 0.0–0.5)
Eosinophils Relative: 2 %
HCT: 36.3 % (ref 36.0–46.0)
Hemoglobin: 11.5 g/dL — ABNORMAL LOW (ref 12.0–15.0)
Immature Granulocytes: 0 %
Lymphocytes Relative: 26 %
Lymphs Abs: 2.3 10*3/uL (ref 0.7–4.0)
MCH: 23 pg — ABNORMAL LOW (ref 26.0–34.0)
MCHC: 31.7 g/dL (ref 30.0–36.0)
MCV: 72.5 fL — ABNORMAL LOW (ref 80.0–100.0)
Monocytes Absolute: 0.8 10*3/uL (ref 0.1–1.0)
Monocytes Relative: 9 %
Neutro Abs: 5.4 10*3/uL (ref 1.7–7.7)
Neutrophils Relative %: 62 %
Platelets: 291 10*3/uL (ref 150–400)
RBC: 5.01 MIL/uL (ref 3.87–5.11)
RDW: 14.8 % (ref 11.5–15.5)
WBC: 8.7 10*3/uL (ref 4.0–10.5)
nRBC: 0 % (ref 0.0–0.2)

## 2022-10-02 LAB — RETICULOCYTES
Immature Retic Fract: 10.6 % (ref 2.3–15.9)
RBC.: 4.99 MIL/uL (ref 3.87–5.11)
Retic Count, Absolute: 25.9 10*3/uL (ref 19.0–186.0)
Retic Ct Pct: 0.5 % (ref 0.4–3.1)

## 2022-10-02 LAB — I-STAT BETA HCG BLOOD, ED (MC, WL, AP ONLY): I-stat hCG, quantitative: <5 m[IU]/mL (ref <5)

## 2022-10-02 LAB — TROPONIN I (HIGH SENSITIVITY): Troponin I (High Sensitivity): 3 ng/L (ref <18)

## 2022-10-02 MED ORDER — SODIUM CHLORIDE 0.9 % IV BOLUS
1000.0000 mL | Freq: Once | INTRAVENOUS | Status: AC
  Administered 2022-10-02: 1000 mL via INTRAVENOUS

## 2022-10-02 MED ORDER — PROCHLORPERAZINE EDISYLATE 10 MG/2ML IJ SOLN
10.0000 mg | Freq: Once | INTRAMUSCULAR | Status: AC
  Administered 2022-10-02: 10 mg via INTRAVENOUS
  Filled 2022-10-02: qty 2

## 2022-10-02 NOTE — Discharge Instructions (Addendum)
Likely you had a migraine, I recommend staying hydrated, get plenty of sleep, decrease in stress, may take over-the-counter pain medication as needed.  If you are concerned that you have sickle cell disease you must follow-up with oncology for further evaluation.  Come back to the emergency department if you develop chest pain, shortness of breath, severe abdominal pain, uncontrolled nausea, vomiting, diarrhea.   Marland Kitchenw

## 2022-10-02 NOTE — ED Notes (Signed)
Returned to triage desk from taking a pt back to a room and pt is asleep in wheelchair at nurses station. Triage RN aware.

## 2022-10-02 NOTE — ED Provider Notes (Signed)
[place] EMERGENCY DEPARTMENT AT [facility] Provider Note   CSN: [id number] Arrival date & time: 10/01/22  2339     History  Chief Complaint  Patient presents with   Body pain    Chloe Valencia is a 52 y.o. female.  HPI   Patient with medical history including fibromyalgia, stomach ulcers, hypertension, PTSD, pyelonephritis, presenting with initial complaint of sickle cell pain.  Patient is a very difficult historian, but states that she been having pain in her left shoulder blade and a headache for the last 4 days, she states that the left shoulder blade pain is constant, does not radiate, no associate chest pain or shortness of breath, she denies any trauma to the area no paresthesia or weakness down the left arm.  She states that the headache is on the back of her head, states it is constant, states she never  had a headache like this before, came on gradually, no change in vision no paresthesias or weakness upper or lower extremities no recent head trauma, she tells me this is typical of her sickle cell pain.  She states that this is typical sickle cell pain, she states that she does not take any medication for her pain  she is just smokes marijuana, states that she recent moved down from [place] and has yet to establish herself [place].  I have reviewed patient's charts going back into 2010 I do not see any diagnosis of sickle cell disease or trait's, also reviewing her chart patient has been seen mainly at [facility] as well as [facility] most recently August 2023.   Home Medications Prior to Admission medications   Not on File      Allergies    Morphine and codeine and Naproxen    Review of Systems   Review of Systems  Constitutional:  Negative for chills and fever.  Respiratory:  Negative for shortness of breath.   Cardiovascular:  Negative for chest pain.  Gastrointestinal:  Negative for abdominal pain.  Musculoskeletal:        Left shoulder  pain  Neurological:  Positive for headaches.    Physical Exam Updated Vital Signs BP 108/88   Pulse 96   Temp (!) 97.4 F (36.3 C) (Oral)   Resp (!) 22   SpO2 100%  Physical Exam Vitals and nursing note reviewed.  Constitutional:      General: She is not in acute distress.    Appearance: She is not ill-appearing.  HENT:     Head: Normocephalic and atraumatic.     Nose: No congestion.  Eyes:     Conjunctiva/sclera: Conjunctivae normal.  Cardiovascular:     Rate and Rhythm: Normal rate and regular rhythm.     Pulses: Normal pulses.     Heart sounds: No murmur heard.    No friction rub. No gallop.  Pulmonary:     Effort: No respiratory distress.     Breath sounds: No wheezing, rhonchi or rales.  Abdominal:     Palpations: Abdomen is soft.     Tenderness: There is no abdominal tenderness. There is no right CVA tenderness or left CVA tenderness.  Musculoskeletal:     Comments: spine was palpated was nontender to palpation no step-off deformities noted no pelvis instability no leg shortening, patient is moving her upper lower extremities out difficulty.  Patient does have point tenderness noted over left scapula without deformity or crepitus present.  She has 2+ dorsal pedal pulses bilaterally,  2+ radial pulses bilaterally.  Skin:    General: Skin is warm and dry.  Neurological:     Mental Status: She is alert.     GCS: GCS eye subscore is 4. GCS verbal subscore is 5. GCS motor subscore is 6.     Cranial Nerves: Cranial nerves 2-12 are intact.     Sensory: Sensation is intact.     Coordination: Coordination is intact.     Comments: No facial asymmetry no difficulty with word finding following two-step commands there is no unilateral weakness present my exam.  Psychiatric:        Mood and Affect: Mood normal.     ED Results / Procedures / Treatments   Labs (all labs ordered are listed, but only abnormal results are displayed) Labs Reviewed  COMPREHENSIVE METABOLIC PANEL  - Abnormal; Notable for the following components:      Result Value   Albumin 3.3 (*)    All other components within normal limits  CBC WITH DIFFERENTIAL/PLATELET - Abnormal; Notable for the following components:   Hemoglobin 11.5 (*)    MCV 72.5 (*)    MCH 23.0 (*)    All other components within normal limits  RETICULOCYTES  RAPID URINE DRUG SCREEN, HOSP PERFORMED  I-STAT BETA HCG BLOOD, ED ([facility], [facility], [facility] ONLY)  TROPONIN I (HIGH SENSITIVITY)    EKG EKG Interpretation  Date/Time:  Wednesday October 02 2022 03:16:44 EDT Ventricular Rate:  87 PR Interval:  142 QRS Duration: 92 QT Interval:  356 QTC Calculation: 429 R Axis:   95 Text Interpretation: Sinus rhythm Confirmed by Nicanor Alcon, April ([id number]) on 10/02/2022 3:32:28 AM  Radiology DG Chest 2 View  Result Date: 10/02/2022 CLINICAL DATA:  Chest pain. EXAM: CHEST - 2 VIEW COMPARISON:  None Available. FINDINGS: The heart size and mediastinal contours are within normal limits. Both lungs are clear. The visualized skeletal structures are unremarkable. Multiple overlying monitor wires. IMPRESSION: No evidence of acute chest disease. Electronically Signed   By: Almira Bar M.D.   On: 10/02/2022 04:40   CT Head Wo Contrast  Result Date: 10/02/2022 CLINICAL DATA:  Headache, increasing frequency or severity. EXAM: CT HEAD WITHOUT CONTRAST TECHNIQUE: Contiguous axial images were obtained from the base of the skull through the vertex without intravenous contrast. RADIATION DOSE REDUCTION: This exam was performed according to the departmental dose-optimization program which includes automated exposure control, adjustment of the mA and/or kV according to patient size and/or use of iterative reconstruction technique. COMPARISON:  04/28/2020 FINDINGS: Brain: No acute intracranial abnormality. Specifically, no hemorrhage, hydrocephalus, mass lesion, acute infarction, or significant intracranial injury. Vascular: No hyperdense vessel or unexpected  calcification. Skull: No acute calvarial abnormality. Sinuses/Orbits: No acute findings Other: None IMPRESSION: No acute intracranial abnormality. Electronically Signed   By: Charlett Nose M.D.   On: 10/02/2022 03:15    Procedures Procedures    Medications Ordered in ED Medications  prochlorperazine (COMPAZINE) injection 10 mg (10 mg Intravenous Given 10/02/22 0243)  sodium chloride 0.9 % bolus 1,000 mL (1,000 mLs Intravenous New Bag/Given 10/02/22 0244)    ED Course/ Medical Decision Making/ A&P                             Medical Decision Making Amount and/or Complexity of Data Reviewed Labs: ordered. Radiology: ordered.  Risk Prescription drug management.   This patient presents to the ED for concern of headache, left shoulder pain, this involves an extensive  number of treatment options, and is a complaint that carries with it a high risk of complications and morbidity.  The differential diagnosis includes ACS, intracranial bleed, dissection, meningitis,    Additional history obtained:  Additional history obtained from N/A External records from outside source obtained and reviewed including recent hospitalization   Co morbidities that complicate the patient evaluation  Polysubstance use psychiatric disorder  Social Determinants of Health:  No primary care provider    Lab Tests:  I Ordered, and personally interpreted labs.  The pertinent results include: CBC shows normocytic anemia hemoglobin 11.5, CMP is unremarkable, reticular count is unremarkable, i-STAT hCG negative   Imaging Studies ordered:  I ordered imaging studies including CT head, chest x-ray I independently visualized and interpreted imaging which showed both negative acute findings I agree with the radiologist interpretation   Cardiac Monitoring:  The patient was maintained on a cardiac monitor.  I personally viewed and interpreted the cardiac monitored which showed an underlying rhythm of:  Without signs of ischemia   Medicines ordered and prescription drug management:  I ordered medication including migraine cocktail I have reviewed the patients home medicines and have made adjustments as needed  Critical Interventions:  N/A   Reevaluation:  Presents with left shoulder pain and headache, stating this is from her sickle cell disease, I do not know any past history of sickle cell disease, will obtain screening lab workup CT imaging for further assessment.  I reassessed the patient, she is found asleep, resting comfortably, when asked patient when and where she was diagnosed with  sickle cell disease she states that her mother told her that she has it it was never formally diagnosed.  Lab work and imaging were reassuring patient was again resting comfortably vital signs reassuring she is agreement discharge at this time.    Consultations Obtained:  N/A    Test Considered:  N/A    Rule out low suspicion for internal head bleed and or mass as CT imaging is negative for acute findings.  Low suspicion for CVA she has no focal deficit present my exam.  Low suspicion for dissection of the vertebral or carotid artery as presentation atypical of etiology.  Low suspicion for meningitis as she has no meningeal sign present. I have low suspicion for ACS as history is atypical, patient has no cardiac history, EKG was sinus rhythm without signs of ischemia, patient had negative troponin.  Low suspicion for PE as patient denies pleuritic chest pain, shortness of breath, vital signs reassuring nontachypneic nonhypoxic patient has low risk factors.  Low suspicion for AAA or aortic dissection as history is atypical, patient has low risk factors.  Low suspicion for systemic infection as patient is nontoxic-appearing, vital signs reassuring, no obvious source infection noted on exam.      Dispostion and problem list  After consideration of the diagnostic results and the patients  response to treatment, I feel that the patent would benefit from discharge.  Headache-since resolved, possibly migraine, while follow-up with neurology for further evaluation. Sickle cell-suspicion for sickle cell disease is very low at this time as she is never been diagnosed, her reticular count is negative, informed patient that if she is concerned that she has sickle cell disease she needs to be evaluated by heme-onc.            Final Clinical Impression(s) / ED Diagnoses Final diagnoses:  Nonintractable headache, unspecified chronicity pattern, unspecified headache type    Rx / DC Orders ED Discharge  Orders     None         Barnie Del 10/02/22 1610    Nicanor Alcon, April, MD 10/02/22 [id number]

## 2022-10-02 NOTE — ED Notes (Signed)
Pt refused an EKG 

## 2022-12-06 ENCOUNTER — Emergency Department (HOSPITAL_COMMUNITY): Payer: 59

## 2022-12-06 ENCOUNTER — Emergency Department (HOSPITAL_COMMUNITY)
Admission: EM | Admit: 2022-12-06 | Discharge: 2022-12-07 | Disposition: A | Discharge status: Home / Self Care | Payer: 59 | Attending: Emergency Medicine | Admitting: Emergency Medicine

## 2022-12-06 DIAGNOSIS — E11649 Type 2 diabetes mellitus with hypoglycemia without coma: Secondary | ICD-10-CM | POA: Diagnosis not present

## 2022-12-06 DIAGNOSIS — T40601A Poisoning by unspecified narcotics, accidental (unintentional), initial encounter: Secondary | ICD-10-CM

## 2022-12-06 DIAGNOSIS — M25532 Pain in left wrist: Secondary | ICD-10-CM | POA: Diagnosis not present

## 2022-12-06 DIAGNOSIS — M25531 Pain in right wrist: Secondary | ICD-10-CM | POA: Insufficient documentation

## 2022-12-06 DIAGNOSIS — I10 Essential (primary) hypertension: Secondary | ICD-10-CM | POA: Diagnosis not present

## 2022-12-06 DIAGNOSIS — R9431 Abnormal electrocardiogram [ECG] [EKG]: Secondary | ICD-10-CM | POA: Diagnosis not present

## 2022-12-06 DIAGNOSIS — E162 Hypoglycemia, unspecified: Secondary | ICD-10-CM | POA: Diagnosis not present

## 2022-12-06 DIAGNOSIS — T402X1A Poisoning by other opioids, accidental (unintentional), initial encounter: Secondary | ICD-10-CM | POA: Diagnosis not present

## 2022-12-06 DIAGNOSIS — R404 Transient alteration of awareness: Secondary | ICD-10-CM | POA: Diagnosis not present

## 2022-12-06 DIAGNOSIS — R4182 Altered mental status, unspecified: Secondary | ICD-10-CM | POA: Diagnosis not present

## 2022-12-06 LAB — CBC WITH DIFFERENTIAL/PLATELET
Abs Immature Granulocytes: 0.02 10*3/uL (ref 0.00–0.07)
Basophils Absolute: 0.1 10*3/uL (ref 0.0–0.1)
Basophils Relative: 1 %
Eosinophils Absolute: 0.2 10*3/uL (ref 0.0–0.5)
Eosinophils Relative: 2 %
HCT: 36 % (ref 36.0–46.0)
Hemoglobin: 10.6 g/dL — ABNORMAL LOW (ref 12.0–15.0)
Immature Granulocytes: 0 %
Lymphocytes Relative: 28 %
Lymphs Abs: 2 10*3/uL (ref 0.7–4.0)
MCH: 21.1 pg — ABNORMAL LOW (ref 26.0–34.0)
MCHC: 29.4 g/dL — ABNORMAL LOW (ref 30.0–36.0)
MCV: 71.6 fL — ABNORMAL LOW (ref 80.0–100.0)
Monocytes Absolute: 0.6 10*3/uL (ref 0.1–1.0)
Monocytes Relative: 9 %
Neutro Abs: 4.3 10*3/uL (ref 1.7–7.7)
Neutrophils Relative %: 60 %
Platelets: 342 10*3/uL (ref 150–400)
RBC: 5.03 MIL/uL (ref 3.87–5.11)
RDW: 16.6 % — ABNORMAL HIGH (ref 11.5–15.5)
WBC: 7.1 10*3/uL (ref 4.0–10.5)
nRBC: 0 % (ref 0.0–0.2)

## 2022-12-06 LAB — COMPREHENSIVE METABOLIC PANEL
ALT: 13 U/L (ref 0–44)
AST: 19 U/L (ref 15–41)
Albumin: 3 g/dL — ABNORMAL LOW (ref 3.5–5.0)
Alkaline Phosphatase: 65 U/L (ref 38–126)
Anion gap: 11 (ref 5–15)
BUN: 16 mg/dL (ref 6–20)
CO2: 24 mmol/L (ref 22–32)
Calcium: 9 mg/dL (ref 8.9–10.3)
Chloride: 102 mmol/L (ref 98–111)
Creatinine, Ser: 0.81 mg/dL (ref 0.44–1.00)
GFR, Estimated: 48 mL/min — ABNORMAL LOW (ref >60)
Glucose, Bld: 64 mg/dL — ABNORMAL LOW (ref 70–99)
Potassium: 3.8 mmol/L (ref 3.5–5.1)
Sodium: 137 mmol/L (ref 135–145)
Total Bilirubin: 0.4 mg/dL (ref 0.3–1.2)
Total Protein: 8.8 g/dL — ABNORMAL HIGH (ref 6.5–8.1)

## 2022-12-06 LAB — CBG MONITORING, ED
Glucose-Capillary: 68 mg/dL — ABNORMAL LOW (ref 70–99)
Glucose-Capillary: 74 mg/dL (ref 70–99)
Glucose-Capillary: 70 mg/dL (ref 70–99)
Glucose-Capillary: 68 mg/dL — ABNORMAL LOW (ref 70–99)

## 2022-12-06 LAB — CK: Total CK: 103 U/L (ref 38–234)

## 2022-12-06 LAB — ETHANOL: Alcohol, Ethyl (B): <10 mg/dL (ref <10)

## 2022-12-06 MED ORDER — LACTATED RINGERS IV BOLUS
1000.0000 mL | Freq: Once | INTRAVENOUS | Status: AC
  Administered 2022-12-06: 1000 mL via INTRAVENOUS
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
## 2022-12-06 NOTE — ED Notes (Signed)
Pt given two sandwiches and a ginger ale.

## 2022-12-06 NOTE — Discharge Instructions (Addendum)
Return to the ER if you develop any other new or concerning symptoms.  Otherwise follow-up with detox to help treat your opiate use disorder.

## 2022-12-07 LAB — RAPID URINE DRUG SCREEN, HOSP PERFORMED
Amphetamines: POSITIVE — AB
Barbiturates: NOT DETECTED
Benzodiazepines: POSITIVE — AB
Cocaine: POSITIVE — AB
Opiates: NOT DETECTED
Tetrahydrocannabinol: POSITIVE — AB

## 2022-12-07 LAB — URINALYSIS, W/ REFLEX TO CULTURE (INFECTION SUSPECTED)
Bilirubin Urine: NEGATIVE
Glucose, UA: NEGATIVE mg/dL
Hgb urine dipstick: NEGATIVE
Ketones, ur: NEGATIVE mg/dL
Nitrite: POSITIVE — AB
Protein, ur: NEGATIVE mg/dL
Specific Gravity, Urine: 1.014 (ref 1.005–1.030)
pH: 6 (ref 5.0–8.0)

## 2022-12-07 LAB — CBG MONITORING, ED: Glucose-Capillary: 123 mg/dL — ABNORMAL HIGH (ref 70–99)

## 2022-12-07 MED ORDER — HALOPERIDOL 5 MG PO TABS: 5.0000 mg | ORAL_TABLET | Freq: Once | ORAL | Status: DC

## 2022-12-07 NOTE — ED Notes (Signed)
The Teacher, musicic) consult has been completed. The primary RN and/or provider have been notified. Please contact the SANE/FNE nurse on call (listed in Amion) with any further concerns.   Patient was not able to be awakened on my first attempt to consult with her. Upon my return to try again, the patient had been discharged.   Providers aware.
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
## 2022-12-07 NOTE — ED Notes (Signed)
Pt refusing all further care. MD notified.

## 2023-01-06 ENCOUNTER — Emergency Department (HOSPITAL_COMMUNITY)

## 2023-01-06 ENCOUNTER — Emergency Department (HOSPITAL_COMMUNITY)
Admission: EM | Admit: 2023-01-06 | Discharge: 2023-01-07 | Disposition: A | Discharge status: Home / Self Care | Attending: Emergency Medicine | Admitting: Emergency Medicine

## 2023-01-06 ENCOUNTER — Other Ambulatory Visit: Payer: Self-pay

## 2023-01-06 DIAGNOSIS — K429 Umbilical hernia without obstruction or gangrene: Secondary | ICD-10-CM | POA: Diagnosis not present

## 2023-01-06 DIAGNOSIS — K5900 Constipation, unspecified: Secondary | ICD-10-CM | POA: Insufficient documentation

## 2023-01-06 DIAGNOSIS — R1033 Periumbilical pain: Secondary | ICD-10-CM | POA: Diagnosis present

## 2023-01-06 DIAGNOSIS — R109 Unspecified abdominal pain: Secondary | ICD-10-CM | POA: Diagnosis not present

## 2023-01-06 LAB — COMPREHENSIVE METABOLIC PANEL
ALT: 10 U/L (ref 0–44)
AST: 17 U/L (ref 15–41)
Albumin: 2.6 g/dL — ABNORMAL LOW (ref 3.5–5.0)
Alkaline Phosphatase: 50 U/L (ref 38–126)
Anion gap: 12 (ref 5–15)
BUN: 10 mg/dL (ref 6–20)
CO2: 26 mmol/L (ref 22–32)
Calcium: 8.9 mg/dL (ref 8.9–10.3)
Chloride: 100 mmol/L (ref 98–111)
Creatinine, Ser: 0.73 mg/dL (ref 0.44–1.00)
GFR, Estimated: >60 mL/min (ref >60)
Glucose, Bld: 108 mg/dL — ABNORMAL HIGH (ref 70–99)
Potassium: 3.8 mmol/L (ref 3.5–5.1)
Sodium: 138 mmol/L (ref 135–145)
Total Bilirubin: 0.1 mg/dL — ABNORMAL LOW (ref 0.3–1.2)
Total Protein: 7.9 g/dL (ref 6.5–8.1)

## 2023-01-06 LAB — TYPE AND SCREEN
ABO/RH(D): B POS
Antibody Screen: NEGATIVE

## 2023-01-06 LAB — CBC
HCT: 30.7 % — ABNORMAL LOW (ref 36.0–46.0)
Hemoglobin: 9.1 g/dL — ABNORMAL LOW (ref 12.0–15.0)
MCH: 21.1 pg — ABNORMAL LOW (ref 26.0–34.0)
MCHC: 29.6 g/dL — ABNORMAL LOW (ref 30.0–36.0)
MCV: 71.2 fL — ABNORMAL LOW (ref 80.0–100.0)
Platelets: 261 10*3/uL (ref 150–400)
RBC: 4.31 MIL/uL (ref 3.87–5.11)
RDW: 16.9 % — ABNORMAL HIGH (ref 11.5–15.5)
WBC: 5.3 10*3/uL (ref 4.0–10.5)
nRBC: 0 % (ref 0.0–0.2)

## 2023-01-06 LAB — HCG, SERUM, QUALITATIVE: Preg, Serum: NEGATIVE

## 2023-01-06 MED ORDER — IOHEXOL 350 MG/ML SOLN
75.0000 mL | Freq: Once | INTRAVENOUS | Status: AC | PRN
  Administered 2023-01-06: 75 mL via INTRAVENOUS

## 2023-01-06 NOTE — ED Triage Notes (Signed)
[facility] with a CC of hernia pain with n/v. Pt relay she has also had some rectal bleeding and a syncopal episode today. Pt denies cp, sob, dizziness.

## 2023-01-06 NOTE — ED Provider Triage Note (Signed)
Emergency Medicine Provider Triage Evaluation Note  Chloe Valencia , a 52 y.o. female  was evaluated in triage.  Pt complains of hernia.  Reports concerns about multiple hernias in her central abdomen as well as bilateral inguinal canals.  Endorses that she is not having some nausea and vomiting but that this is large improved.  Also had some rectal bleeding but states this is also improving.  Endorses some level of constipation.  Patient reports an episode of syncope as well earlier today.  Denies any chest pain, shortness breath, dizziness.  Review of Systems  Positive: As above Negative: As above  Physical Exam  BP 114/68 (BP Location: Right Arm)   Pulse 71   Temp 98.2 F (36.8 C) (Oral)   Resp 16   Ht 5\' 7"  (1.702 m)   Wt 81.6 kg   SpO2 98%   BMI 28.19 kg/m  Gen:   Awake, no distress   Resp:  Normal effort  MSK:   Moves extremities without difficulty  Other:  Multiple areas of tenderness in the abdomen, significant tenderness in bilateral inguinal canals  Medical Decision Making  Medically screening exam initiated at 7:17 PM.  Appropriate orders placed.  Chloe Valencia was informed that the remainder of the evaluation will be completed by another provider, this initial triage assessment does not replace that evaluation, and the importance of remaining in the ED until their evaluation is complete.  Physical exam difficult to perform due to patient in the hallway.   Smitty Knudsen, PA-C 01/06/23 [phone]

## 2023-01-07 MED ORDER — ACETAMINOPHEN 10 MG/ML IV SOLN
1000.0000 mg | Freq: Four times a day (QID) | INTRAVENOUS | Status: DC
  Administered 2023-01-07: 1000 mg via INTRAVENOUS
  Filled 2023-01-07 (×2): qty 100

## 2023-01-07 MED ORDER — KETOROLAC TROMETHAMINE 30 MG/ML IJ SOLN: 15.0000 mg | Freq: Once | INTRAMUSCULAR | Status: DC

## 2023-01-07 MED ORDER — DOCUSATE SODIUM 100 MG PO CAPS: ORAL_CAPSULE | ORAL | 0 refills | Status: AC

## 2023-01-07 MED ORDER — ACETAMINOPHEN 500 MG PO TABS: 1000.0000 mg | ORAL_TABLET | Freq: Three times a day (TID) | ORAL | 0 refills | Status: AC | PRN

## 2023-01-07 MED ORDER — ACETAMINOPHEN 10 MG/ML IV SOLN: 1000.0000 mg | Freq: Four times a day (QID) | INTRAVENOUS | Status: DC

## 2023-01-07 MED ORDER — ONDANSETRON HCL 4 MG/2ML IJ SOLN
4.0000 mg | Freq: Once | INTRAMUSCULAR | Status: AC
  Administered 2023-01-07: 4 mg via INTRAVENOUS
  Filled 2023-01-07: qty 2

## 2023-01-07 MED ORDER — POLYETHYLENE GLYCOL 3350 17 GM/SCOOP PO POWD: 17.0000 g | Freq: Two times a day (BID) | ORAL | 0 refills | Status: AC

## 2023-01-07 MED ORDER — HYDROMORPHONE HCL 1 MG/ML IJ SOLN: 1.0000 mg | Freq: Once | INTRAMUSCULAR | Status: DC

## 2023-01-07 NOTE — ED Provider Notes (Signed)
[facility] Emergency Department Provider Note MRN:  [id number]  Arrival date & time: 01/07/23     Chief Complaint   Hernia   History of Present Illness   Chloe Valencia is a 52 y.o. year-old female presents to the ED with chief complaint of of abdominal mass.  She states that she has had periumbilical pain and mass that has been worsening over the past few days.  She states that it is tender to the touch and keeps getting bigger.  She denies fever, chills, nausea, vomiting, or diarrhea.  She does states that she has been constipated and she has intermittent BRBPR.  She says that it is not a constant problem.  Marland Kitchen  History provided by patient.   Review of Systems  Pertinent positive and negative review of systems noted in HPI.    Physical Exam   Vitals:   01/07/23 0224 01/07/23 0405  BP: (!) 117/101 119/79  Pulse: 100 (!) 58  Resp: 17 16  Temp: 97.6 F (36.4 C)   SpO2: 100% 100%    CONSTITUTIONAL:  non toxic-appearing, NAD NEURO:  Alert and oriented x 3, CN 3-12 grossly intact EYES:  eyes equal and reactive ENT/NECK:  Supple, no stridor  CARDIO:  normal rate, regular rhythm, appears well-perfused  PULM:  No respiratory distress, CTAB GI/GU:  non-distended, 2x2 cm umbilical hernia MSK/SPINE:  No gross deformities, no edema, moves all extremities  SKIN:  no rash, atraumatic   *Additional and/or pertinent findings included in MDM below  Diagnostic and Interventional Summary    EKG Interpretation Date/Time:    Ventricular Rate:    PR Interval:    QRS Duration:    QT Interval:    QTC Calculation:   R Axis:      Text Interpretation:         Labs Reviewed  COMPREHENSIVE METABOLIC PANEL - Abnormal; Notable for the following components:      Result Value   Glucose, Bld 108 (*)    Albumin 2.6 (*)    Total Bilirubin 0.1 (*)    All other components within normal limits  CBC - Abnormal; Notable for the following components:    Hemoglobin 9.1 (*)    HCT 30.7 (*)    MCV 71.2 (*)    MCH 21.1 (*)    MCHC 29.6 (*)    RDW 16.9 (*)    All other components within normal limits  HCG, SERUM, QUALITATIVE  POC OCCULT BLOOD, ED  TYPE AND SCREEN    CT ABDOMEN PELVIS W CONTRAST  Final Result      Medications  acetaminophen (OFIRMEV) IV 1,000 mg (0 mg Intravenous Stopped 01/07/23 0544)  iohexol (OMNIPAQUE) 350 MG/ML injection 75 mL (75 mLs Intravenous Contrast Given 01/06/23 2149)  ondansetron (ZOFRAN) injection 4 mg (4 mg Intravenous Given 01/07/23 0446)     Procedures  /  Critical Care Procedures  ED Course and Medical Decision Making  I have reviewed the triage vital signs, the nursing notes, and pertinent available records from the EMR.  Social Determinants Affecting Complexity of Care: Patient has no clinically significant social determinants affecting this chief complaint..   ED Course:    Medical Decision Making Patient here with abdominal mass.  Appears consistent with umbilical hernia.    CT and labs done in triage.  CT notable for constipation and umbilical hernia which appears to be fat containing.  I called and discussed the case with Dr. Royanne Foots.  States no emergent  intervention needed.    I did attempt to reduce the hernia, but was unable to reduce it.  I discussed with the patient that she will continue to have some pain as the fat dies.  Will have her follow-up [facility].  Will give bowel regimen for constipation.    Risk OTC drugs. Prescription drug management.         Consultants: I consulted with Dr. Dossie Der, who recommends bowel regimen and outpatient follow-up with [facility].   Treatment and Plan: I considered admission due to patient's initial presentation, but after considering the examination and diagnostic results, patient will not require admission and can be discharged with outpatient follow-up.  Patient seen by and discussed with attending physician, Dr. Nicanor Alcon, who  agrees with plan.  Final Clinical Impressions(s) / ED Diagnoses     ICD-10-CM   1. Umbilical hernia without obstruction and without gangrene  K42.9     2. Constipation, unspecified constipation type  K59.00       ED Discharge Orders          Ordered    polyethylene glycol powder (GLYCOLAX/MIRALAX) 17 GM/SCOOP powder  2 times daily        01/07/23 0553    docusate sodium (COLACE) 100 MG capsule        01/07/23 0553    acetaminophen (TYLENOL) 500 MG tablet  Every 8 hours PRN        01/07/23 0553              Discharge Instructions Discussed with and Provided to Patient:   Discharge Instructions   None      Roxy Horseman, PA-C 01/07/23 0559    Palumbo, April, MD 01/07/23 [phone]

## 2023-02-02 ENCOUNTER — Emergency Department: Payer: 59

## 2023-02-02 ENCOUNTER — Emergency Department
Admission: EM | Admit: 2023-02-02 | Discharge: 2023-02-03 | Disposition: A | Discharge status: Home / Self Care | Payer: 59 | Attending: Emergency Medicine | Admitting: Emergency Medicine

## 2023-02-02 DIAGNOSIS — M791 Myalgia, unspecified site: Secondary | ICD-10-CM | POA: Diagnosis not present

## 2023-02-02 DIAGNOSIS — M25532 Pain in left wrist: Secondary | ICD-10-CM | POA: Diagnosis not present

## 2023-02-02 DIAGNOSIS — F1910 Other psychoactive substance abuse, uncomplicated: Secondary | ICD-10-CM | POA: Diagnosis not present

## 2023-02-02 DIAGNOSIS — R4182 Altered mental status, unspecified: Secondary | ICD-10-CM | POA: Diagnosis not present

## 2023-02-02 DIAGNOSIS — R1111 Vomiting without nausea: Secondary | ICD-10-CM | POA: Diagnosis not present

## 2023-02-02 DIAGNOSIS — W44H1XA Needle entering into or through a natural orifice, initial encounter: Secondary | ICD-10-CM | POA: Insufficient documentation

## 2023-02-02 MED ORDER — DROPERIDOL 2.5 MG/ML IJ SOLN: 5.0000 mg | Freq: Once | INTRAMUSCULAR | Status: DC

## 2023-02-02 MED ORDER — DROPERIDOL 2.5 MG/ML IJ SOLN
5.0000 mg | Freq: Once | INTRAMUSCULAR | Status: AC
  Administered 2023-02-02: 5 mg via INTRAMUSCULAR

## 2023-02-03 NOTE — ED Provider Notes (Signed)
-----------------------------------------   9:19 AM on 02/03/2023 -----------------------------------------  Patient is now awake alert, ambulating without issue, eating food.  Patient appears safe for discharge home we will discharge home at this time as the patient has been in the emergency department 16 hours.   Minna Antis, MD 02/03/23 [phone]

## 2023-02-03 NOTE — ED Provider Notes (Signed)
11:15 PM  Assumed care at shift change.  Patient here with agitation after being arrested by police.  Required droperidol.  Currently resting.  Workup has been unremarkable.  Patient be reassessed once awake and likely discharged home.  6:36 AM  Pt is still significantly sedated from previous droperidol.  Will be signed out to oncoming team at 7 AM but anticipate discharge home once she is awake, ambulatory, tolerating p.o.   Ladarrell Cornwall, Layla Maw, DO 02/03/23 [phone]

## 2023-02-03 NOTE — ED Notes (Signed)
Pt walking halls asking for food and drink, Pt provided with same and MD made aware.

## 2023-02-13 NOTE — Plan of Care (Signed)
[facility] Tonsillectomy/Adenoidectomy, Postoperative PEDS care plan entered in error.

## 2023-02-14 NOTE — Plan of Care (Signed)
 [facility] Tonsillectomy/Adenoidectomy, Postoperative PEDS care plan entered in error.

## 2023-04-09 ENCOUNTER — Emergency Department (HOSPITAL_COMMUNITY)
Admission: EM | Admit: 2023-04-09 | Discharge: 2023-04-09 | Disposition: A | Discharge status: Home / Self Care | Payer: 59 | Attending: Emergency Medicine | Admitting: Emergency Medicine

## 2023-04-09 ENCOUNTER — Other Ambulatory Visit: Payer: Self-pay

## 2023-04-09 DIAGNOSIS — M791 Myalgia, unspecified site: Secondary | ICD-10-CM | POA: Diagnosis present

## 2023-04-09 LAB — COMPREHENSIVE METABOLIC PANEL
ALT: 12 U/L (ref 0–44)
AST: 19 U/L (ref 15–41)
Albumin: 3.6 g/dL (ref 3.5–5.0)
Alkaline Phosphatase: 70 U/L (ref 38–126)
Anion gap: 7 (ref 5–15)
BUN: 12 mg/dL (ref 6–20)
CO2: 26 mmol/L (ref 22–32)
Calcium: 9.1 mg/dL (ref 8.9–10.3)
Chloride: 103 mmol/L (ref 98–111)
Creatinine, Ser: 0.76 mg/dL (ref 0.44–1.00)
GFR, Estimated: >60 mL/min (ref >60)
Glucose, Bld: 93 mg/dL (ref 70–99)
Potassium: 4 mmol/L (ref 3.5–5.1)
Sodium: 136 mmol/L (ref 135–145)
Total Bilirubin: 0.6 mg/dL (ref <1.2)
Total Protein: 8.4 g/dL — ABNORMAL HIGH (ref 6.5–8.1)

## 2023-04-09 LAB — CBC WITH DIFFERENTIAL/PLATELET
Abs Immature Granulocytes: 0.02 10*3/uL (ref 0.00–0.07)
Basophils Absolute: 0 10*3/uL (ref 0.0–0.1)
Basophils Relative: 1 %
Eosinophils Absolute: 0.1 10*3/uL (ref 0.0–0.5)
Eosinophils Relative: 2 %
HCT: 39.6 % (ref 36.0–46.0)
Hemoglobin: 12 g/dL (ref 12.0–15.0)
Immature Granulocytes: 0 %
Lymphocytes Relative: 21 %
Lymphs Abs: 1.5 10*3/uL (ref 0.7–4.0)
MCH: 22.2 pg — ABNORMAL LOW (ref 26.0–34.0)
MCHC: 30.3 g/dL (ref 30.0–36.0)
MCV: 73.3 fL — ABNORMAL LOW (ref 80.0–100.0)
Monocytes Absolute: 0.4 10*3/uL (ref 0.1–1.0)
Monocytes Relative: 6 %
Neutro Abs: 4.9 10*3/uL (ref 1.7–7.7)
Neutrophils Relative %: 70 %
Platelets: 235 10*3/uL (ref 150–400)
RBC: 5.4 MIL/uL — ABNORMAL HIGH (ref 3.87–5.11)
RDW: 16.3 % — ABNORMAL HIGH (ref 11.5–15.5)
WBC: 7 10*3/uL (ref 4.0–10.5)
nRBC: 0 % (ref 0.0–0.2)

## 2023-04-09 LAB — ETHANOL: Alcohol, Ethyl (B): <10 mg/dL (ref <10)

## 2023-04-09 MED ORDER — SODIUM CHLORIDE 0.9 % IV BOLUS: 1000.0000 mL | Freq: Once | INTRAVENOUS | Status: DC

## 2023-04-09 NOTE — ED Provider Notes (Addendum)
[place] EMERGENCY DEPARTMENT AT [facility] Provider Note   CSN: [id number] Arrival date & time: 04/09/23  7829     History  Chief Complaint  Patient presents with   Generalized Body Aches    Chloe Valencia is a 52 y.o. female.  Patient was being arrested by the police and she stated she needed to go to the hospital for evaluation.  Patient complains of hurting all over.  The history is provided by the patient and medical records. No language interpreter was used.  Muscle Pain This is a new problem. The current episode started 6 to 12 hours ago. The problem occurs constantly. The problem has not changed since onset.Pertinent negatives include no chest pain, no abdominal pain and no headaches. Nothing relieves the symptoms.       Home Medications Prior to Admission medications   Medication Sig Start Date End Date Taking? Authorizing Provider  albuterol (PROVENTIL) (2.5 MG/3ML) 0.083% nebulizer solution Take 2.5 mg by nebulization every 6 (six) hours as needed.    [provider]  hydrochlorothiazide (HYDRODIURIL) 25 MG tablet Take 25 mg by mouth daily.    [provider]  HYDROcodone-acetaminophen (NORCO) 10-325 MG per tablet Take 1 tablet by mouth every 6 (six) hours as needed.    [provider]  ibuprofen (ADVIL,MOTRIN) 800 MG tablet Take 800 mg by mouth 2 (two) times daily.    [provider]  ipratropium (ATROVENT) 0.03 % nasal spray Place 2 sprays into the nose 4 (four) times daily. 08/10/11 08/09/12  Copland, Gwenlyn Found, MD  lisinopril (PRINIVIL,ZESTRIL) 10 MG tablet Take 10 mg by mouth daily.    [provider]      Allergies    Iodine and Contrast media [iodinated contrast media]    Review of Systems   Review of Systems  Constitutional:  Negative for appetite change and fatigue.       Hurting all over  HENT:  Negative for congestion, ear discharge and sinus pressure.   Eyes:  Negative for discharge.   Respiratory:  Negative for cough.   Cardiovascular:  Negative for chest pain.  Gastrointestinal:  Negative for abdominal pain and diarrhea.  Genitourinary:  Negative for frequency and hematuria.  Musculoskeletal:  Negative for back pain.  Skin:  Negative for rash.  Neurological:  Negative for seizures and headaches.  Psychiatric/Behavioral:  Negative for hallucinations.     Physical Exam Updated Vital Signs BP 100/62 (BP Location: Right Arm)   Pulse 75   Temp 97.7 F (36.5 C) (Oral)   Resp 18   Ht 5\' 6"  (1.676 m)   Wt 72.6 kg   SpO2 95%   BMI 25.82 kg/m  Physical Exam Vitals and nursing note reviewed.  Constitutional:      Appearance: She is well-developed.  HENT:     Head: Normocephalic.     Nose: Nose normal.  Eyes:     General: No scleral icterus.    Conjunctiva/sclera: Conjunctivae normal.  Neck:     Thyroid: No thyromegaly.  Cardiovascular:     Rate and Rhythm: Normal rate and regular rhythm.     Heart sounds: No murmur heard.    No friction rub. No gallop.  Pulmonary:     Breath sounds: No stridor. No wheezing or rales.  Chest:     Chest wall: No tenderness.  Abdominal:     General: There is no distension.     Tenderness: There is no abdominal tenderness. There is no  rebound.  Musculoskeletal:        General: Normal range of motion.     Cervical back: Neck supple.  Lymphadenopathy:     Cervical: No cervical adenopathy.  Skin:    Findings: No erythema or rash.  Neurological:     Mental Status: She is alert and oriented to person, place, and time.     Motor: No abnormal muscle tone.     Coordination: Coordination normal.  Psychiatric:        Behavior: Behavior normal.     ED Results / Procedures / Treatments   Labs (all labs ordered are listed, but only abnormal results are displayed) Labs Reviewed  CBC WITH DIFFERENTIAL/PLATELET - Abnormal; Notable for the following components:      Result Value   RBC 5.40 (*)    MCV 73.3 (*)    MCH 22.2 (*)     RDW 16.3 (*)    All other components within normal limits  COMPREHENSIVE METABOLIC PANEL - Abnormal; Notable for the following components:   Total Protein 8.4 (*)    All other components within normal limits  ETHANOL    EKG None  Radiology No results found.  Procedures Procedures    Medications Ordered in ED Medications - No data to display  ED Course/ Medical Decision Making/ A&P                                 Medical Decision Making Amount and/or Complexity of Data Reviewed Labs: ordered.   Patient with myalgias and normal labs and normal vital signs.  She will be discharged home and told to take Tylenol and follow-up with PCP.  Suspect patient is malingering because she did not complain of pain until the police tried to arrest her        Final Clinical Impression(s) / ED Diagnoses Final diagnoses:  Myalgia    Rx / DC Orders ED Discharge Orders     None         Bethann Berkshire, MD 04/09/23 1624    Bethann Berkshire, MD 04/09/23 1625    Bethann Berkshire, MD 04/11/23 1754
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
## 2023-04-09 NOTE — Discharge Instructions (Signed)
Drink plenty of fluids and take Tylenol for pain and follow-up next week with the family doctor if not improving

## 2023-05-18 ENCOUNTER — Emergency Department (HOSPITAL_COMMUNITY): Payer: 59

## 2023-05-18 ENCOUNTER — Other Ambulatory Visit: Payer: Self-pay

## 2023-05-18 ENCOUNTER — Emergency Department (HOSPITAL_COMMUNITY)
Admission: EM | Admit: 2023-05-18 | Discharge: 2023-05-18 | Disposition: A | Discharge status: Home / Self Care | Payer: 59 | Attending: Student | Admitting: Student

## 2023-05-18 ENCOUNTER — Encounter (HOSPITAL_COMMUNITY): Payer: Self-pay

## 2023-05-18 DIAGNOSIS — S30810A Abrasion of lower back and pelvis, initial encounter: Secondary | ICD-10-CM | POA: Insufficient documentation

## 2023-05-18 DIAGNOSIS — S3991XA Unspecified injury of abdomen, initial encounter: Secondary | ICD-10-CM | POA: Diagnosis not present

## 2023-05-18 DIAGNOSIS — F1490 Cocaine use, unspecified, uncomplicated: Secondary | ICD-10-CM

## 2023-05-18 DIAGNOSIS — S3992XA Unspecified injury of lower back, initial encounter: Secondary | ICD-10-CM | POA: Diagnosis not present

## 2023-05-18 DIAGNOSIS — M25512 Pain in left shoulder: Secondary | ICD-10-CM

## 2023-05-18 DIAGNOSIS — S199XXA Unspecified injury of neck, initial encounter: Secondary | ICD-10-CM | POA: Diagnosis not present

## 2023-05-18 DIAGNOSIS — X58XXXA Exposure to other specified factors, initial encounter: Secondary | ICD-10-CM | POA: Insufficient documentation

## 2023-05-18 DIAGNOSIS — S0990XA Unspecified injury of head, initial encounter: Secondary | ICD-10-CM | POA: Diagnosis not present

## 2023-05-18 DIAGNOSIS — M545 Low back pain, unspecified: Secondary | ICD-10-CM | POA: Diagnosis not present

## 2023-05-18 DIAGNOSIS — S299XXA Unspecified injury of thorax, initial encounter: Secondary | ICD-10-CM | POA: Diagnosis not present

## 2023-05-18 DIAGNOSIS — S3993XA Unspecified injury of pelvis, initial encounter: Secondary | ICD-10-CM | POA: Diagnosis not present

## 2023-05-18 DIAGNOSIS — M19012 Primary osteoarthritis, left shoulder: Secondary | ICD-10-CM | POA: Diagnosis not present

## 2023-05-18 LAB — CBC WITH DIFFERENTIAL/PLATELET
Abs Immature Granulocytes: 0.02 10*3/uL (ref 0.00–0.07)
Basophils Absolute: 0.1 10*3/uL (ref 0.0–0.1)
Basophils Relative: 1 %
Eosinophils Absolute: 0.1 10*3/uL (ref 0.0–0.5)
Eosinophils Relative: 1 %
HCT: 38.5 % (ref 36.0–46.0)
Hemoglobin: 11.7 g/dL — ABNORMAL LOW (ref 12.0–15.0)
Immature Granulocytes: 0 %
Lymphocytes Relative: 17 %
Lymphs Abs: 1.4 10*3/uL (ref 0.7–4.0)
MCH: 22 pg — ABNORMAL LOW (ref 26.0–34.0)
MCHC: 30.4 g/dL (ref 30.0–36.0)
MCV: 72.4 fL — ABNORMAL LOW (ref 80.0–100.0)
Monocytes Absolute: 0.4 10*3/uL (ref 0.1–1.0)
Monocytes Relative: 4 %
Neutro Abs: 6.5 10*3/uL (ref 1.7–7.7)
Neutrophils Relative %: 77 %
Platelets: 216 10*3/uL (ref 150–400)
RBC: 5.32 MIL/uL — ABNORMAL HIGH (ref 3.87–5.11)
RDW: 16.5 % — ABNORMAL HIGH (ref 11.5–15.5)
WBC: 8.4 10*3/uL (ref 4.0–10.5)
nRBC: 0 % (ref 0.0–0.2)

## 2023-05-18 LAB — URINALYSIS, ROUTINE W REFLEX MICROSCOPIC
Bilirubin Urine: NEGATIVE
Glucose, UA: NEGATIVE mg/dL
Hgb urine dipstick: NEGATIVE
Ketones, ur: 20 mg/dL — AB
Leukocytes,Ua: NEGATIVE
Nitrite: NEGATIVE
Protein, ur: NEGATIVE mg/dL
Specific Gravity, Urine: 1.017 (ref 1.005–1.030)
pH: 5 (ref 5.0–8.0)

## 2023-05-18 LAB — COMPREHENSIVE METABOLIC PANEL WITH GFR
ALT: 17 U/L (ref 0–44)
AST: 27 U/L (ref 15–41)
Albumin: 3.5 g/dL (ref 3.5–5.0)
Alkaline Phosphatase: 75 U/L (ref 38–126)
Anion gap: 12 (ref 5–15)
BUN: 21 mg/dL — ABNORMAL HIGH (ref 6–20)
CO2: 23 mmol/L (ref 22–32)
Calcium: 9.2 mg/dL (ref 8.9–10.3)
Chloride: 102 mmol/L (ref 98–111)
Creatinine, Ser: 0.8 mg/dL (ref 0.44–1.00)
GFR, Estimated: >60 mL/min (ref >60)
Glucose, Bld: 157 mg/dL — ABNORMAL HIGH (ref 70–99)
Potassium: 4 mmol/L (ref 3.5–5.1)
Sodium: 137 mmol/L (ref 135–145)
Total Bilirubin: 1.4 mg/dL — ABNORMAL HIGH (ref 0.0–1.2)
Total Protein: 7.8 g/dL (ref 6.5–8.1)

## 2023-05-18 MED ORDER — ACETAMINOPHEN 325 MG PO TABS
650.0000 mg | ORAL_TABLET | Freq: Once | ORAL | Status: DC
Start: 2023-05-18 — End: 2023-05-18
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
## 2023-05-18 NOTE — ED Notes (Signed)
Pt upset because we had tried to get blood work.  Upset because we woke her up. Upset because we are making her sit up.  Upset and argumentative with staff all morning and now complaining that she wants SW consult even thought I tried to explain to her they are not here today.

## 2023-05-18 NOTE — ED Provider Notes (Incomplete)
  Physical Exam  BP (!) 143/98 (BP Location: Left Arm)   Pulse 84   Temp 98.4 F (36.9 C) (Axillary)   Resp 18   SpO2 98%   Physical Exam  Procedures  Procedures  ED Course / MDM    Medical Decision Making Amount and/or Complexity of Data Reviewed Labs: ordered.   Intoxicated MTF
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
## 2023-05-18 NOTE — ED Notes (Signed)
When attempting to obtain blood work, pt was initially sleeping. Due to her position, this Medic had to wake pt and ask her for there hand. Pt. Was not cooperative, rocked side to side yelling "I'm up!"

## 2023-05-21 ENCOUNTER — Encounter (HOSPITAL_COMMUNITY): Payer: Self-pay | Admitting: Emergency Medicine

## 2023-05-21 ENCOUNTER — Other Ambulatory Visit: Payer: Self-pay

## 2023-05-21 ENCOUNTER — Emergency Department (HOSPITAL_COMMUNITY)
Admission: EM | Admit: 2023-05-21 | Discharge: 2023-05-22 | Disposition: A | Discharge status: Home / Self Care | Payer: 59 | Attending: Student | Admitting: Student

## 2023-05-21 DIAGNOSIS — F22 Delusional disorders: Secondary | ICD-10-CM | POA: Diagnosis not present

## 2023-05-21 DIAGNOSIS — R4689 Other symptoms and signs involving appearance and behavior: Secondary | ICD-10-CM

## 2023-05-21 DIAGNOSIS — R456 Violent behavior: Secondary | ICD-10-CM | POA: Insufficient documentation

## 2023-05-21 DIAGNOSIS — F1910 Other psychoactive substance abuse, uncomplicated: Secondary | ICD-10-CM | POA: Insufficient documentation

## 2023-05-21 DIAGNOSIS — R451 Restlessness and agitation: Secondary | ICD-10-CM | POA: Insufficient documentation

## 2023-05-21 DIAGNOSIS — F29 Unspecified psychosis not due to a substance or known physiological condition: Secondary | ICD-10-CM | POA: Diagnosis not present

## 2023-05-21 DIAGNOSIS — W19XXXA Unspecified fall, initial encounter: Secondary | ICD-10-CM | POA: Diagnosis not present

## 2023-05-21 DIAGNOSIS — R404 Transient alteration of awareness: Secondary | ICD-10-CM | POA: Diagnosis not present

## 2023-05-21 LAB — ACETAMINOPHEN LEVEL: Acetaminophen (Tylenol), Serum: <10 ug/mL — ABNORMAL LOW (ref 10–30)

## 2023-05-21 LAB — COMPREHENSIVE METABOLIC PANEL WITH GFR
ALT: 17 U/L (ref 0–44)
AST: 25 U/L (ref 15–41)
Albumin: 3.4 g/dL — ABNORMAL LOW (ref 3.5–5.0)
Alkaline Phosphatase: 64 U/L (ref 38–126)
Anion gap: 8 (ref 5–15)
BUN: 18 mg/dL (ref 6–20)
CO2: 24 mmol/L (ref 22–32)
Calcium: 8.8 mg/dL — ABNORMAL LOW (ref 8.9–10.3)
Chloride: 107 mmol/L (ref 98–111)
Creatinine, Ser: 0.73 mg/dL (ref 0.44–1.00)
GFR, Estimated: >60 mL/min (ref >60)
Glucose, Bld: 86 mg/dL (ref 70–99)
Potassium: 3.6 mmol/L (ref 3.5–5.1)
Sodium: 139 mmol/L (ref 135–145)
Total Bilirubin: 0.7 mg/dL (ref 0.0–1.2)
Total Protein: 7.4 g/dL (ref 6.5–8.1)

## 2023-05-21 LAB — RAPID HIV SCREEN (HIV 1/2 AB+AG)
HIV 1/2 Antibodies: NONREACTIVE
HIV-1 P24 Antigen - HIV24: NONREACTIVE

## 2023-05-21 LAB — CBC WITH DIFFERENTIAL/PLATELET
Abs Immature Granulocytes: 0.02 10*3/uL (ref 0.00–0.07)
Basophils Absolute: 0 10*3/uL (ref 0.0–0.1)
Basophils Relative: 1 %
Eosinophils Absolute: 0.2 10*3/uL (ref 0.0–0.5)
Eosinophils Relative: 3 %
HCT: 34.4 % — ABNORMAL LOW (ref 36.0–46.0)
Hemoglobin: 10.7 g/dL — ABNORMAL LOW (ref 12.0–15.0)
Immature Granulocytes: 0 %
Lymphocytes Relative: 31 %
Lymphs Abs: 2.1 10*3/uL (ref 0.7–4.0)
MCH: 22.6 pg — ABNORMAL LOW (ref 26.0–34.0)
MCHC: 31.1 g/dL (ref 30.0–36.0)
MCV: 72.7 fL — ABNORMAL LOW (ref 80.0–100.0)
Monocytes Absolute: 0.5 10*3/uL (ref 0.1–1.0)
Monocytes Relative: 8 %
Neutro Abs: 3.9 10*3/uL (ref 1.7–7.7)
Neutrophils Relative %: 57 %
Platelets: 219 10*3/uL (ref 150–400)
RBC: 4.73 MIL/uL (ref 3.87–5.11)
RDW: 16.5 % — ABNORMAL HIGH (ref 11.5–15.5)
WBC: 6.8 10*3/uL (ref 4.0–10.5)
nRBC: 0 % (ref 0.0–0.2)

## 2023-05-21 LAB — HCG, SERUM, QUALITATIVE: Preg, Serum: NEGATIVE

## 2023-05-21 LAB — ETHANOL: Alcohol, Ethyl (B): <10 mg/dL (ref <10)

## 2023-05-21 LAB — SALICYLATE LEVEL: Salicylate Lvl: <7 mg/dL — ABNORMAL LOW (ref 7.0–30.0)

## 2023-05-21 MED ORDER — DIPHENHYDRAMINE HCL 50 MG/ML IJ SOLN: 25.0000 mg | Freq: Four times a day (QID) | INTRAMUSCULAR | Status: DC | PRN

## 2023-05-21 MED ORDER — STERILE WATER FOR INJECTION IJ SOLN
INTRAMUSCULAR | Status: AC
  Filled 2023-05-21: qty 10

## 2023-05-21 MED ORDER — ZIPRASIDONE MESYLATE 20 MG IM SOLR
10.0000 mg | Freq: Four times a day (QID) | INTRAMUSCULAR | Status: DC | PRN
  Filled 2023-05-21: qty 20
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
## 2023-05-21 NOTE — ED Provider Notes (Signed)
[place] EMERGENCY DEPARTMENT AT [facility] Provider Note   CSN: [id number] Arrival date & time: 05/21/23  1905     History  Chief Complaint  Patient presents with   Aggressive Behavior    Chloe Valencia is a 53 y.o. female with past medical history significant for polysubstance abuse, homelessness who was recently seen evaluated in the emergency department for agitation, intoxication, who [place] mall due to aggressive behavior.  She was agitated, flailing, authorities had to provide Haldol 5 mg and route.  She is now sleeping.  She has declined any lab work or vital signs.  She arouses to sternal rub.  HPI     Home Medications Prior to Admission medications   Not on File      Allergies    Patient has no known allergies.    Review of Systems   Review of Systems  All other systems reviewed and are negative.   Physical Exam Updated Vital Signs BP (!) 152/81   Pulse 85   Resp 18   SpO2 98%  Physical Exam Vitals and nursing note reviewed.  Constitutional:      General: She is not in acute distress.    Appearance: Normal appearance.     Comments: Disheveled but nontoxic, nonseptic appearing  HENT:     Head: Normocephalic and atraumatic.  Eyes:     General:        Right eye: No discharge.        Left eye: No discharge.     Comments: Pupils are equal round, reactive to light when checked, she is not opening them spontaneously but they will open with sternal rub  Cardiovascular:     Rate and Rhythm: Normal rate and regular rhythm.     Heart sounds: No murmur heard.    No friction rub. No gallop.  Pulmonary:     Effort: Pulmonary effort is normal.     Breath sounds: Normal breath sounds.  Abdominal:     General: Bowel sounds are normal.     Palpations: Abdomen is soft.  Skin:    General: Skin is warm and dry.     Capillary Refill: Capillary refill takes less than 2 seconds.  Neurological:     Mental Status: She is alert.   Psychiatric:     Comments: Arouses to sternal rub     ED Results / Procedures / Treatments   Labs (all labs ordered are listed, but only abnormal results are displayed) Labs Reviewed  COMPREHENSIVE METABOLIC PANEL - Abnormal; Notable for the following components:      Result Value   Calcium 8.8 (*)    Albumin 3.4 (*)    All other components within normal limits  CBC WITH DIFFERENTIAL/PLATELET - Abnormal; Notable for the following components:   Hemoglobin 10.7 (*)    HCT 34.4 (*)    MCV 72.7 (*)    MCH 22.6 (*)    RDW 16.5 (*)    All other components within normal limits  SALICYLATE LEVEL - Abnormal; Notable for the following components:   Salicylate Lvl <7.0 (*)    All other components within normal limits  ACETAMINOPHEN LEVEL - Abnormal; Notable for the following components:   Acetaminophen (Tylenol), Serum <10 (*)    All other components within normal limits  ETHANOL  HCG, SERUM, QUALITATIVE  RAPID HIV SCREEN (HIV 1/2 AB+AG)  RAPID URINE DRUG SCREEN, HOSP PERFORMED  HEPATITIS PANEL, ACUTE    EKG  None  Radiology No results found.  Procedures Procedures    Medications Ordered in ED Medications  ziprasidone (GEODON) injection 10 mg (has no administration in time range)  diphenhydrAMINE (BENADRYL) injection 25 mg (has no administration in time range)  sterile water (preservative free) injection (has no administration in time range)    ED Course/ Medical Decision Making/ A&P                                 Medical Decision Making  Patient is a 53 y.o. female  who presents to the emergency department for agitation, substance abuse  Past Medical History: Polysubstance abuse  Physical Exam: Pupils are equal round, reactive to light when checked, she is not opening them spontaneously but they will open with sternal rub   Disheveled but nontoxic, nonseptic appearing   Labs: Medical clearance labs ordered, with following pertinent results: CBC notable for  anemia, hemoglobin 10.7 from recent baseline of 11.7, low clinical suspicion for acute GI losses however, may be secondary to volume status.  Hospitalized CMP unremarkable, ethanol, salicylate, acetaminophen are all unremarkable.  Serum pregnancy test negative.  HIV nonreactive.  Cardiac monitoring: EKG obtained and interpreted by attending physician which shows: NSR  Medications: As needed Geodon, Benadryl as needed for any return of agitation  Disposition: Patient is resting comfortably, will need to be reassessed, but otherwise no evidence of acute medical abnormality and stable for discharge.  I discussed this case with my attending physician Dr. Posey Rea who cosigned this note including patient's presenting symptoms, physical exam, and planned diagnostics and interventions. Attending physician stated agreement with plan or made changes to plan which were implemented.   11:15 PM Care of Chloe Valencia transferred to [facility] Sharilyn Sites and Dr. Bebe Shaggy at the end of my shift as the patient will require reassessment once labs/imaging have resulted. Patient presentation, ED course, and plan of care discussed with review of all pertinent labs and imaging. Please see his/her note for further details regarding further ED course and disposition. Plan at time of handoff is reassess once metabolized, and likely discharge.. This may be altered or completely changed at the discretion of the oncoming team pending results of further workup.   Final Clinical Impression(s) / ED Diagnoses Final diagnoses:  Aggressive behavior  Polysubstance abuse [facility])    Rx / DC Orders ED Discharge Orders     None         [place] 05/21/23 2315    Glendora Score, MD 05/22/23 1205

## 2023-05-22 LAB — HEPATITIS PANEL, ACUTE
HCV Ab: NONREACTIVE
Hep A IgM: NONREACTIVE
Hep B C IgM: NONREACTIVE
Hepatitis B Surface Ag: NONREACTIVE

## 2023-05-22 NOTE — ED Notes (Signed)
Pt provided biscuit and drink

## 2023-05-22 NOTE — ED Provider Notes (Signed)
Care assumed from PA Sharilyn Sites at shift change, please see notes from prior providers for full details, but in brief Chloe Valencia is a 53 y.o. female who arrived for agitation and aggressive behavior after substance use, patient has history of polysubstance abuse.  Received Haldol with EMS en route and has been sleeping.  Lab evaluation overall unremarkable.  Patient is now awake ambulatory eating and drinking, no longer exhibiting aggressive behavior.  Appropriate for discharge home.    Rosezella Rumpf, PA-C 05/22/23 1610    Zadie Rhine, MD 05/23/23 [phone]

## 2023-05-22 NOTE — ED Provider Notes (Signed)
  5:39 AM Patient aroused to voice, opened her eyes briefly but then immediately began snoring again.  Will continue to monitor.  Care will be signed out to oncoming provider.  Anticipate discharge once fully awake/alert.   Garlon Hatchet, PA-C 05/22/23 1610    Zadie Rhine, MD 05/22/23 [phone]

## 2023-08-24 ENCOUNTER — Emergency Department (HOSPITAL_COMMUNITY)
Admission: EM | Admit: 2023-08-24 | Discharge: 2023-08-24 | Disposition: A | Discharge status: Home / Self Care | Attending: Emergency Medicine | Admitting: Emergency Medicine

## 2023-08-24 ENCOUNTER — Encounter (HOSPITAL_COMMUNITY): Payer: Self-pay

## 2023-08-24 ENCOUNTER — Other Ambulatory Visit: Payer: Self-pay

## 2023-08-24 DIAGNOSIS — F1910 Other psychoactive substance abuse, uncomplicated: Secondary | ICD-10-CM | POA: Insufficient documentation

## 2023-08-24 MED ORDER — ACETAMINOPHEN 500 MG PO TABS
1000.0000 mg | ORAL_TABLET | Freq: Once | ORAL | Status: AC
  Administered 2023-08-24: 1000 mg via ORAL
  Filled 2023-08-24: qty 2

## 2023-08-24 MED ORDER — BUPRENORPHINE HCL 2 MG SL SUBL
4.0000 mg | SUBLINGUAL_TABLET | Freq: Once | SUBLINGUAL | Status: DC
  Filled 2023-08-24: qty 2

## 2023-08-24 NOTE — ED Notes (Signed)
 Stuck pt once, blood stopped flowing. Pt refused to be stuck again.

## 2023-08-24 NOTE — ED Notes (Signed)
 Patient refused blood tests . MD notified .

## 2023-08-24 NOTE — ED Triage Notes (Signed)
 Pt was caught stealing and did heroin. Hx of sickle cell and wants medical clearance. Axox4.

## 2023-08-24 NOTE — ED Provider Notes (Signed)
 [place] EMERGENCY DEPARTMENT AT [facility] Provider Note   CSN: [id number] Arrival date & time: 08/24/23  1831     History  Chief Complaint  Patient presents with   Withdrawal    Chloe Valencia is a 53 y.o. female.  Patient is a 53 year old female with a history of polysubstance abuse including opiate addiction with heroin and fentanyl who has had numerous ER visits in the past for her overdose who is presenting today with police reporting she is in withdrawal from opiates.  Police report that they caught the patient trying to steal something and she had a warrant out for her arrest.  When she was going to be taken to jail she started reporting that she was having withdrawal.  The patient tells me that she has sickle cell however there is no medical record anywhere to suggest the patient has sickle cell anemia.  She reports she last used any drugs off the street 2 days ago and she is having nausea, abdominal pain and feeling unwell.  She will not give any further history.  The history is provided by the patient, the police and medical records.       Home Medications Prior to Admission medications   Medication Sig Start Date End Date Taking? Authorizing Provider  acetaminophen  (TYLENOL ) 500 MG tablet Take 2 tablets (1,000 mg total) by mouth every 8 (eight) hours as needed. 01/07/23   Sherel Dikes, PA-C  docusate sodium  (COLACE) 100 MG capsule Take 2 tablets per day until you have a BM, then 1 per day until soft. 01/07/23   Sherel Dikes, PA-C  polyethylene glycol powder (GLYCOLAX /MIRALAX ) 17 GM/SCOOP powder Take 17 g by mouth 2 (two) times daily. Until daily soft stools  OTC 01/07/23   Sherel Dikes, PA-C      Allergies    Morphine and codeine and Naproxen    Review of Systems   Review of Systems  Physical Exam Updated Vital Signs BP (!) 168/96 (BP Location: Left Arm)   Pulse 81   Temp 97.9 F (36.6 C) (Oral)   Resp 18   SpO2 100%  Physical  Exam Vitals and nursing note reviewed.  Constitutional:      General: She is not in acute distress.    Appearance: She is well-developed.  HENT:     Head: Normocephalic and atraumatic.  Eyes:     Pupils: Pupils are equal, round, and reactive to light.  Cardiovascular:     Rate and Rhythm: Normal rate and regular rhythm.     Heart sounds: Normal heart sounds. No murmur heard.    No friction rub.  Pulmonary:     Effort: Pulmonary effort is normal.     Breath sounds: Normal breath sounds. No wheezing or rales.  Abdominal:     General: Bowel sounds are normal. There is no distension.     Palpations: Abdomen is soft.     Tenderness: There is no abdominal tenderness. There is no guarding or rebound.  Musculoskeletal:        General: Tenderness present. Normal range of motion.     Comments: No edema.  She reports there is tenderness everywhere she is touched  Skin:    General: Skin is warm and dry.     Findings: No rash.  Neurological:     Mental Status: She is alert and oriented to person, place, and time. Mental status is at baseline.     Cranial Nerves: No cranial nerve deficit.  Psychiatric:     Comments: Guarded     ED Results / Procedures / Treatments   Labs (all labs ordered are listed, but only abnormal results are displayed) Labs Reviewed  CBC WITH DIFFERENTIAL/PLATELET  COMPREHENSIVE METABOLIC PANEL WITH GFR  HCG, SERUM, QUALITATIVE    EKG EKG Interpretation Date/Time:  Sunday Aug 24 2023 19:53:38 EDT Ventricular Rate:  75 PR Interval:  160 QRS Duration:  80 QT Interval:  394 QTC Calculation: 439 R Axis:   89  Text Interpretation: Normal sinus rhythm Normal ECG When compared with ECG of 02-Oct-2022 03:16, PREVIOUS ECG IS PRESENT Confirmed by Almond Army ([id number]) on 08/24/2023 8:00:54 PM  Radiology No results found.  Procedures Procedures    Medications Ordered in ED Medications  acetaminophen  (TYLENOL ) tablet 1,000 mg (has no administration in  time range)    ED Course/ Medical Decision Making/ A&P                                 Medical Decision Making Amount and/or Complexity of Data Reviewed Labs: ordered. ECG/medicine tests: ordered and independent interpretation performed. Decision-making details documented in ED Course.  Risk OTC drugs. Prescription drug management.   Patient presenting stating she is withdrawing from opiates today.  Patient has normal vital signs and appears to be in no acute distress.  She reports she last had opiates 2 days ago.  She is currently in the custody of the police.  She reports she has sickle cell disease however looking through numerous patient charts there is no history of any sickle cell disease.  Patient refused labs here.  She was given a dose of Subutex.  Will get an EKG but otherwise feel the patient is medically clear for police custody. Patient is refusing all blood work and I independently interpreted her EKG which was normal.  She has not had no obvious signs of withdrawal here such as vomiting, piloerection, diaphoresis.  Suspect she said these things so she would not have to go to jail.  Will hold on giving Subutex due to concern that it could cause active withdrawal.  Feel that patient is stable to be discharged with police.        Final Clinical Impression(s) / ED Diagnoses Final diagnoses:  Polysubstance abuse [facility])    Rx / DC Orders ED Discharge Orders     None         Almond Army, MD 08/24/23 2047

## 2023-08-26 ENCOUNTER — Encounter (HOSPITAL_COMMUNITY): Payer: Self-pay
